# Patient Record
Sex: Female | Born: 1985 | Hispanic: Yes | Marital: Married | Smoking: Never smoker
Health system: Southern US, Community
[De-identification: ages and names within clinical notes are randomized; demographics above are authoritative.]

## PROBLEM LIST (undated history)

## (undated) ENCOUNTER — Inpatient Hospital Stay (HOSPITAL_COMMUNITY): Payer: Self-pay

## (undated) DIAGNOSIS — Z789 Other specified health status: Secondary | ICD-10-CM

## (undated) DIAGNOSIS — O139 Gestational [pregnancy-induced] hypertension without significant proteinuria, unspecified trimester: Secondary | ICD-10-CM

## (undated) HISTORY — PX: WISDOM TOOTH EXTRACTION: SHX21

---

## 2013-11-02 LAB — OB RESULTS CONSOLE RPR: RPR: NONREACTIVE

## 2013-11-02 LAB — OB RESULTS CONSOLE GC/CHLAMYDIA
CHLAMYDIA, DNA PROBE: NEGATIVE
Gonorrhea: NEGATIVE

## 2013-11-02 LAB — OB RESULTS CONSOLE ANTIBODY SCREEN: Antibody Screen: NEGATIVE

## 2013-11-02 LAB — OB RESULTS CONSOLE HIV ANTIBODY (ROUTINE TESTING): HIV: NONREACTIVE

## 2013-11-02 LAB — OB RESULTS CONSOLE ABO/RH: RH TYPE: POSITIVE

## 2013-11-02 LAB — OB RESULTS CONSOLE RUBELLA ANTIBODY, IGM: Rubella: IMMUNE

## 2013-11-02 LAB — OB RESULTS CONSOLE HEPATITIS B SURFACE ANTIGEN: Hepatitis B Surface Ag: NEGATIVE

## 2014-02-14 ENCOUNTER — Other Ambulatory Visit: Payer: Self-pay

## 2014-03-01 ENCOUNTER — Other Ambulatory Visit: Payer: Self-pay | Admitting: Certified Nurse Midwife

## 2014-03-01 ENCOUNTER — Encounter (HOSPITAL_COMMUNITY): Payer: Self-pay | Admitting: *Deleted

## 2014-03-01 ENCOUNTER — Inpatient Hospital Stay (HOSPITAL_COMMUNITY): Payer: 59

## 2014-03-01 ENCOUNTER — Encounter: Payer: Self-pay | Admitting: Obstetrics and Gynecology

## 2014-03-01 ENCOUNTER — Inpatient Hospital Stay (HOSPITAL_COMMUNITY)
Admission: AD | Admit: 2014-03-01 | Discharge: 2014-03-01 | Disposition: A | Discharge status: Home / Self Care | Payer: 59 | Source: Ambulatory Visit | Attending: Obstetrics and Gynecology | Admitting: Obstetrics and Gynecology

## 2014-03-01 DIAGNOSIS — M545 Low back pain, unspecified: Secondary | ICD-10-CM | POA: Insufficient documentation

## 2014-03-01 DIAGNOSIS — R1032 Left lower quadrant pain: Secondary | ICD-10-CM | POA: Insufficient documentation

## 2014-03-01 DIAGNOSIS — K649 Unspecified hemorrhoids: Secondary | ICD-10-CM

## 2014-03-01 DIAGNOSIS — O4400 Placenta previa specified as without hemorrhage, unspecified trimester: Secondary | ICD-10-CM | POA: Insufficient documentation

## 2014-03-01 HISTORY — DX: Other specified health status: Z78.9

## 2014-03-01 LAB — URINALYSIS, ROUTINE W REFLEX MICROSCOPIC
Bilirubin Urine: NEGATIVE
Glucose, UA: NEGATIVE mg/dL
KETONES UR: NEGATIVE mg/dL
Leukocytes, UA: NEGATIVE
NITRITE: NEGATIVE
PH: 6 (ref 5.0–8.0)
PROTEIN: NEGATIVE mg/dL
Specific Gravity, Urine: 1.015 (ref 1.005–1.030)
Urobilinogen, UA: 0.2 mg/dL (ref 0.0–1.0)

## 2014-03-01 LAB — URINE MICROSCOPIC-ADD ON

## 2014-03-01 NOTE — MAU Note (Signed)
C/o L lower back and L lower abdominal pain since 1000 today;

## 2014-03-01 NOTE — Discharge Instructions (Signed)
Placenta Previa   Placenta previa is a condition in pregnant women where the placenta implants in the lower part of the uterus. The placenta either partially or completely covers the opening to the cervix. This is a problem because the baby must pass through the cervix during delivery. There are three types of placenta previa. They include:   1. Marginal placenta previa. The placenta is near the cervix, but does not cover the opening.  2. Partial placenta previa. The placenta covers part of the cervical opening.  3. Complete placenta previa. The placenta covers the entire cervical opening.    Depending on the type of placenta previa, there is a chance the placenta may move into a normal position and no longer cover the cervix as the pregnancy progresses. It is important to keep all prenatal visits with your caregiver.   RISK FACTORS  You may be more likely to develop placenta previa if you:   · Are carrying more than one baby (multiples).    · Have an abnormally shaped uterus.    · Have scars on the lining of the uterus.    · Had previous surgeries involving the uterus, such as a cesarean delivery.    · Have delivered a baby previously.    · Have a history of placenta previa.    · Have smoked or used cocaine during pregnancy.    · Are age 35 or older during pregnancy.    SYMPTOMS  The main symptom of placenta previa is sudden, painless vaginal bleeding during the second half of pregnancy. The amount of bleeding can be light to very heavy. The bleeding may stop on its own, but almost always returns. Cramping, regular contractions, abdominal pain, and lower back pain can also occur with placenta previa.   DIAGNOSIS  Placenta previa can be diagnosed through an ultrasound by finding where the placenta is located. The ultrasound may find placenta previa either during a routine prenatal visit or after vaginal bleeding is noticed. If you are diagnosed with placenta previa, your caregiver may avoid vaginal exams to reduce  the risk of heavy bleeding. There is a chance that placenta previa may not be diagnosed until bleeding occurs during labor.   TREATMENT  Specific treatment depends on:   · How much you are bleeding or if the bleeding has stopped.  · How far along you are in your pregnancy.    · The condition of the baby.    · The location of the baby and placenta.    · The type of placenta previa.    Depending on the factors above, your caregiver may recommend:   · Decreased activity.    · Bed rest at home or in the hospital.  · Pelvic rest. This means no sex, using tampons, douching, pelvic exams, or placing anything into the vagina.  · A blood transfusion to replace maternal blood loss.  · A cesarean delivery if the bleeding is heavy and cannot be controlled or the placenta completely covers the cervix.  · Medication to stop premature labor or mature the fetal lungs if delivery is needed before the pregnancy is full term.    WHEN SHOULD YOU SEEK IMMEDIATE MEDICAL CARE IF YOU ARE SENT HOME WITH PLACENTA PREVIA?  Seek immediate medical care if you show any symptoms of placenta previa. You will need to go to the hospital to get checked immediately. Again, those symptoms are:  · Sudden, painless vaginal bleeding, even a small amount.  · Cramping or regular contractions.  · Lower back or abdominal pain.  Document Released: 08/17/2005   Document Revised: 04/19/2013 Document Reviewed: 11/18/2012  ExitCare® Patient Information ©2015 [employer]. This information is not intended to replace advice given to you by your health care provider. Make sure you discuss any questions you have with your health care provider.

## 2014-03-01 NOTE — MAU Provider Note (Signed)
History   27yo G1 P0 at 24.6 weeks  W/known Previa  at 6/16 US Pt present to MAU c/o continuous LLQ and lower back 8/10 this am that was unrelieved with tylenol.   She denies lof, vb, intercourse, abn activity, fever, n/v or dysuria.  +FM, no cramping  Patient Active Problem List   Diagnosis Date Noted  . Placenta previa 03/01/2014    Chief Complaint  Patient presents with  . Back Pain  . Abdominal Pain   HPI  See above  OB History   Grav Para Term Preterm Abortions TAB SAB Ect Mult Living   1               Past Medical History  Diagnosis Date  . Medical history non-contributory     Past Surgical History  Procedure Laterality Date  . No past surgeries      Family History  Problem Relation Age of Onset  . Alcohol abuse Neg Hx   . Arthritis Neg Hx   . Asthma Neg Hx   . Birth defects Neg Hx   . Cancer Neg Hx   . COPD Neg Hx   . Depression Neg Hx   . Diabetes Neg Hx   . Drug abuse Neg Hx   . Early death Neg Hx   . Hearing loss Neg Hx   . Heart disease Neg Hx   . Hyperlipidemia Neg Hx   . Kidney disease Neg Hx   . Hypertension Neg Hx   . Learning disabilities Neg Hx   . Mental illness Neg Hx   . Mental retardation Neg Hx   . Miscarriages / Stillbirths Neg Hx   . Stroke Neg Hx   . Vision loss Neg Hx   . Varicose Veins Neg Hx     History  Substance Use Topics  . Smoking status: Never Smoker   . Smokeless tobacco: Not on file  . Alcohol Use: No    Allergies: No Known Allergies  Prescriptions prior to admission  Medication Sig Dispense Refill  . acetaminophen (TYLENOL) 500 MG tablet Take 1,000 mg by mouth every 6 (six) hours as needed for mild pain.      . Prenatal Vit-Fe Fumarate-FA (PRENATAL MULTIVITAMIN) TABS tablet Take 1 tablet by mouth daily at 12 noon.        ROS   LLQ pain and left sided back pain.  +FM  Physical Exam     Blood pressure 100/56, pulse 78, temperature 98.1 F (36.7 C), temperature source Oral, resp. rate 18, height 4\' 11"   (1.499 m), weight 136 lb (61.689 kg).  Physical Exam  Constitutional: She is oriented to person, place, and time. She appears well-developed.  HENT:  Head: Normocephalic.  Neck: Normal range of motion.  Respiratory: Breath sounds normal.  Neurological: She is alert and oriented to person, place, and time.  Skin: Skin is warm and dry.  Chest: BBS Heart: RRR ABD: Gravid Soft and non tender except for LLQ tenderness with palpation Pelvic: deferred d/t previa, no bleeding noted Extremities: WNL FHT: reassuring for EGA CTX: irritability with occasional ctx   ED Course  Assessment: IUP 24.6 known previa LLQ abd and back pain    Plan: UA Limited US for placenta location and cervix  Consult with Dr Normand Sloopillard Report given to [facility] CNM.     Standard, Venus CNM, MSN 03/01/2014 6:25 PM   Addendum: at 2100  Pt denies any pain now,  US WNL, anterior previa identified, no other  abnormalities  Pt instructed on bleeding precautions, pelvic rest May return to work tmrw  dc'd home stable F/u routine  S.Amani Marseille, CNM

## 2014-04-03 ENCOUNTER — Other Ambulatory Visit: Payer: Self-pay

## 2014-04-03 ENCOUNTER — Other Ambulatory Visit: Payer: Self-pay | Admitting: Obstetrics and Gynecology

## 2014-04-03 DIAGNOSIS — Z3689 Encounter for other specified antenatal screening: Secondary | ICD-10-CM

## 2014-04-03 DIAGNOSIS — O4400 Placenta previa specified as without hemorrhage, unspecified trimester: Secondary | ICD-10-CM

## 2014-04-13 ENCOUNTER — Encounter (HOSPITAL_COMMUNITY): Payer: 59

## 2014-04-13 ENCOUNTER — Other Ambulatory Visit (HOSPITAL_COMMUNITY): Payer: 59

## 2014-04-17 ENCOUNTER — Other Ambulatory Visit: Payer: Self-pay | Admitting: Obstetrics and Gynecology

## 2014-04-17 ENCOUNTER — Ambulatory Visit (HOSPITAL_COMMUNITY)
Admission: RE | Admit: 2014-04-17 | Discharge: 2014-04-17 | Disposition: A | Discharge status: Home / Self Care | Payer: 59 | Source: Ambulatory Visit | Attending: Obstetrics and Gynecology | Admitting: Obstetrics and Gynecology

## 2014-04-17 ENCOUNTER — Encounter (HOSPITAL_COMMUNITY): Payer: Self-pay

## 2014-04-17 DIAGNOSIS — Z3689 Encounter for other specified antenatal screening: Secondary | ICD-10-CM | POA: Insufficient documentation

## 2014-04-17 DIAGNOSIS — O4410 Placenta previa with hemorrhage, unspecified trimester: Secondary | ICD-10-CM | POA: Diagnosis not present

## 2014-04-17 DIAGNOSIS — O4403 Placenta previa specified as without hemorrhage, third trimester: Secondary | ICD-10-CM

## 2014-04-17 DIAGNOSIS — O4400 Placenta previa specified as without hemorrhage, unspecified trimester: Secondary | ICD-10-CM

## 2014-04-17 NOTE — Progress Notes (Signed)
[facility] ultrasound  Indication: 28 yr old G1P0 at 4731w4d with placenta previa for fetal ultrasound.  Findings: 1. Single intrauterine pregnancy. 2. Estimated fetal weight is in the 59th%. 3. Anterior placenta previa. 4. Normal amniotic fluid index. 5. Normal transvaginal cervical length. 6. Anatomy survey is limited by advanced gestational age and fetal position; no abnormalities seen.  Recommendations: 1. Appropriate fetal growth. 2. Normal limited anatomy survey: - patient previously had normal anatomy survey with primary OB 3. Placenta previa: - see consult letter - Discussed increased risk of bleeding which could be severe bleeding - Recommend pelvic rest with no intercourse  - Recommend no strenuous activity - Discussed bleeding precautions- patient is to call her primary Ob with any vaginal bleeding and be evaluated  - Recommend reevaluate placental location in 4 weeks; if the previa does not resolve we would recommend delivery via C section at 37 weeks as the risk of vaginal bleeding with labor is increased  Eulis FosterKristen Syrianna Schillaci, MD

## 2014-04-17 NOTE — Consult Note (Signed)
MFM consult  28 yr old G1P0 at 2951w4d with placenta previa referred by Dr. Su Hiltoberts for fetal ultrasound and consult.  Past OB hx: primagravid   Ultrasound today shows: single intrauterine pregnancy.  Estimated fetal weight is in the 59th%.  Anterior placenta previa.  Normal amniotic fluid index. Normal transvaginal cervical length.  Anatomy survey is limited by advanced gestational age and fetal position; no abnormalities seen.   I counseled the patient as follows:  1. Appropriate fetal growth.  2. Normal limited anatomy survey:  - patient previously had normal anatomy survey with primary OB  3. Placenta previa:  - Discussed increased risk of bleeding which could be severe bleeding  - Recommend pelvic rest with no intercourse  - Recommend no strenuous activity  - Discussed bleeding precautions- patient is to call her primary Ob with any vaginal bleeding and be evaluated  - Recommend reevaluate placental location in 4 weeks; if the previa does not resolve we would recommend delivery via C section at 37 weeks as the risk of vaginal bleeding with labor is increased   I spent a total of 30 minutes with the patient of which >50% was in face to face consultation.  Eulis FosterKristen Hodari Chuba, MD

## 2014-04-18 ENCOUNTER — Other Ambulatory Visit: Payer: Self-pay | Admitting: Obstetrics and Gynecology

## 2014-04-18 DIAGNOSIS — Z3689 Encounter for other specified antenatal screening: Secondary | ICD-10-CM

## 2014-04-18 DIAGNOSIS — O4400 Placenta previa specified as without hemorrhage, unspecified trimester: Secondary | ICD-10-CM

## 2014-04-28 ENCOUNTER — Encounter (HOSPITAL_COMMUNITY): Payer: Self-pay

## 2014-04-28 ENCOUNTER — Inpatient Hospital Stay (HOSPITAL_COMMUNITY)
Admission: AD | Admit: 2014-04-28 | Discharge: 2014-04-28 | Disposition: A | Discharge status: Home / Self Care | Payer: 59 | Source: Ambulatory Visit | Attending: Obstetrics & Gynecology | Admitting: Obstetrics & Gynecology

## 2014-04-28 DIAGNOSIS — N898 Other specified noninflammatory disorders of vagina: Secondary | ICD-10-CM | POA: Diagnosis not present

## 2014-04-28 DIAGNOSIS — O99891 Other specified diseases and conditions complicating pregnancy: Secondary | ICD-10-CM | POA: Insufficient documentation

## 2014-04-28 DIAGNOSIS — O4700 False labor before 37 completed weeks of gestation, unspecified trimester: Secondary | ICD-10-CM | POA: Diagnosis not present

## 2014-04-28 DIAGNOSIS — O4703 False labor before 37 completed weeks of gestation, third trimester: Secondary | ICD-10-CM

## 2014-04-28 DIAGNOSIS — R109 Unspecified abdominal pain: Secondary | ICD-10-CM | POA: Diagnosis not present

## 2014-04-28 DIAGNOSIS — O26899 Other specified pregnancy related conditions, unspecified trimester: Secondary | ICD-10-CM

## 2014-04-28 DIAGNOSIS — O4400 Placenta previa specified as without hemorrhage, unspecified trimester: Secondary | ICD-10-CM | POA: Diagnosis present

## 2014-04-28 DIAGNOSIS — O9989 Other specified diseases and conditions complicating pregnancy, childbirth and the puerperium: Principal | ICD-10-CM

## 2014-04-28 LAB — URINE MICROSCOPIC-ADD ON

## 2014-04-28 LAB — URINALYSIS, ROUTINE W REFLEX MICROSCOPIC
BILIRUBIN URINE: NEGATIVE
Glucose, UA: NEGATIVE mg/dL
Ketones, ur: NEGATIVE mg/dL
Nitrite: NEGATIVE
PH: 7 (ref 5.0–8.0)
Protein, ur: NEGATIVE mg/dL
Specific Gravity, Urine: <1.005 — ABNORMAL LOW (ref 1.005–1.030)
Urobilinogen, UA: 0.2 mg/dL (ref 0.0–1.0)

## 2014-04-28 LAB — WET PREP, GENITAL
CLUE CELLS WET PREP: NONE SEEN
Trich, Wet Prep: NONE SEEN
YEAST WET PREP: NONE SEEN

## 2014-04-28 LAB — POCT FERN TEST: POCT Fern Test: NEGATIVE

## 2014-04-28 MED ORDER — LACTATED RINGERS IV BOLUS (SEPSIS)
500.0000 mL | Freq: Once | INTRAVENOUS | Status: AC
  Administered 2014-04-28: 500 mL via INTRAVENOUS

## 2014-04-28 MED ORDER — LACTATED RINGERS IV SOLN: INTRAVENOUS | Status: DC

## 2014-04-28 NOTE — MAU Note (Signed)
I was at work and felt some vag d/c. Went to [facility] and had a lot of clear, sticky mucousy d/c. Some pressure in lower abd for a wk. Comes and goes.

## 2014-04-28 NOTE — MAU Provider Note (Signed)
History  28 yo G1P0 @ 33 1/7 wks w/ known anterior previa presents to MAU w/ c/o change in discharge. States around noon today she noticed that her underwear were damp and that her discharge appeared thicker than normal and mucousy. Denies vaginal bleeding or odor. Reports pelvic rest and no strenuous activity since dx of previa on 04/17/14. 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 Only wants work note for today. Has f/u scan around 05/19/14. Also reports intermittent low abdominal discomfort that she has had for about a week.   Patient Active Problem List   Diagnosis Date Noted  . Vaginal discharge, non-hemorrhagic 04/28/2014  . Abdominal cramping affecting pregnancy 04/28/2014  . Preterm uterine contractions in third trimester, antepartum 04/28/2014  . Placenta previa 03/01/2014  . Hemorrhoid 03/01/2014    Chief Complaint  Patient presents with  . Vaginal Discharge  . Abdominal Pain   HPI See above OB History   Grav Para Term Preterm Abortions TAB SAB Ect Mult Living   1               Past Medical History  Diagnosis Date  . Medical history non-contributory     Past Surgical History  Procedure Laterality Date  . No past surgeries      Family History  Problem Relation Age of Onset  . Alcohol abuse Neg Hx   . Arthritis Neg Hx   . Asthma Neg Hx   . Birth defects Neg Hx   . Cancer Neg Hx   . COPD Neg Hx   . Depression Neg Hx   . Diabetes Neg Hx   . Drug abuse Neg Hx   . Early death Neg Hx   . Hearing loss Neg Hx   . Heart disease Neg Hx   . Hyperlipidemia Neg Hx   . Kidney disease Neg Hx   . Hypertension Neg Hx   . Learning disabilities Neg Hx   . Mental illness Neg Hx   . Mental retardation Neg Hx   . Miscarriages / Stillbirths Neg Hx   . Stroke Neg Hx   . Vision loss Neg Hx   . Varicose Veins Neg Hx     History  Substance Use Topics  . Smoking status: Never Smoker   . Smokeless tobacco: Not on file  . Alcohol Use: No    Allergies: No Known  Allergies  Prescriptions prior to admission  Medication Sig Dispense Refill  . acetaminophen (TYLENOL) 500 MG tablet Take 1,000 mg by mouth every 6 (six) hours as needed for mild pain.      . Prenatal Vit-Fe Fumarate-FA (PRENATAL MULTIVITAMIN) TABS tablet Take 1 tablet by mouth daily.         ROS +FM No VB Low abdominal pain Change in vaginal discharge Physical Exam   Blood pressure 104/59, pulse 90, temperature 98.1 F (36.7 C), resp. rate 18, height  (1.499 m), weight 149 lb 9.6 oz (67.858 kg), last menstrual period 09/08/2013. Results for orders placed during the hospital encounter of 04/28/14 (from the past 24 hour(s))  URINALYSIS, ROUTINE W REFLEX MICROSCOPIC     Status: Abnormal   Collection Time    04/28/14  8:05 PM      Result Value Ref Range   Color, Urine YELLOW  YELLOW   APPearance CLEAR  CLEAR   Specific Gravity, Urine <1.005 (*) 1.005 - 1.030   pH 7.0  5.0 - 8.0   Glucose, UA NEGATIVE  NEGATIVE mg/dL   Hgb urine  dipstick SMALL (*) NEGATIVE   Bilirubin Urine NEGATIVE  NEGATIVE   Ketones, ur NEGATIVE  NEGATIVE mg/dL   Protein, ur NEGATIVE  NEGATIVE mg/dL   Urobilinogen, UA 0.2  0.0 - 1.0 mg/dL   Nitrite NEGATIVE  NEGATIVE   Leukocytes, UA TRACE (*) NEGATIVE  URINE MICROSCOPIC-ADD ON     Status: Abnormal   Collection Time    04/28/14  8:05 PM      Result Value Ref Range   Squamous Epithelial / LPF FEW (*) RARE   WBC, UA 0-2  <3 WBC/hpf   RBC / HPF 3-6  <3 RBC/hpf   Bacteria, UA RARE  RARE  WET PREP, GENITAL     Status: Abnormal   Collection Time    04/28/14  9:27 PM      Result Value Ref Range   Yeast Wet Prep HPF POC NONE SEEN  NONE SEEN   Trich, Wet Prep NONE SEEN  NONE SEEN   Clue Cells Wet Prep HPF POC NONE SEEN  NONE SEEN   WBC, Wet Prep HPF POC FEW (*) NONE SEEN  POCT FERN TEST     Status: None   Collection Time    04/28/14  9:35 PM      Result Value Ref Range   POCT Fern Test Negative = intact amniotic membranes      Physical Exam Gen:  NAD Lungs: CTAB CV: RRR Abdomen: gravid, soft, NTND, no guarding, no rebound Pelvic: Gentle spec exam showed moderate amount of thick white discharge, no odor, no bleeding. Sample taken for wet prep. Couple declined GC/CT Neg pooling, neg valsalva, neg fern  Cervix appears closed FHRT reasurring for this gestational age Toco: Irritability w/ occ, mild ctxs; resolved w/ IVFs ED Course  Assessment: Anterior previa since 04/17/14 Leukorrhea Wet prep neg Neg ROM  PTCs   Plan: Pt was presented to Dr. Sallye Ober prior to gentle spec exam LR 500 ml bolus, then 125 ml/hr  Sherre Scarlet CNM, MS 04/28/2014 10:08 PM  ADDENDUM:  Reasurrance given to pt Continue to follow all instructions regarding previa Strict PTL precautions Admits to feeling better post IVFs Work note Keep next [employer] appt

## 2014-04-28 NOTE — Discharge Instructions (Signed)
Placenta Previa   Placenta previa is a condition in pregnant women where the placenta implants in the lower part of the uterus. The placenta either partially or completely covers the opening to the cervix. This is a problem because the baby must pass through the cervix during delivery. There are three types of placenta previa. They include:   1. Marginal placenta previa. The placenta is near the cervix, but does not cover the opening.  2. Partial placenta previa. The placenta covers part of the cervical opening.  3. Complete placenta previa. The placenta covers the entire cervical opening.    Depending on the type of placenta previa, there is a chance the placenta may move into a normal position and no longer cover the cervix as the pregnancy progresses. It is important to keep all prenatal visits with your caregiver.   RISK FACTORS  You may be more likely to develop placenta previa if you:   · Are carrying more than one baby (multiples).    · Have an abnormally shaped uterus.    · Have scars on the lining of the uterus.    · Had previous surgeries involving the uterus, such as a cesarean delivery.    · Have delivered a baby previously.    · Have a history of placenta previa.    · Have smoked or used cocaine during pregnancy.    · Are age 35 or older during pregnancy.    SYMPTOMS  The main symptom of placenta previa is sudden, painless vaginal bleeding during the second half of pregnancy. The amount of bleeding can be light to very heavy. The bleeding may stop on its own, but almost always returns. Cramping, regular contractions, abdominal pain, and lower back pain can also occur with placenta previa.   DIAGNOSIS  Placenta previa can be diagnosed through an ultrasound by finding where the placenta is located. The ultrasound may find placenta previa either during a routine prenatal visit or after vaginal bleeding is noticed. If you are diagnosed with placenta previa, your caregiver may avoid vaginal exams to reduce  the risk of heavy bleeding. There is a chance that placenta previa may not be diagnosed until bleeding occurs during labor.   TREATMENT  Specific treatment depends on:   · How much you are bleeding or if the bleeding has stopped.  · How far along you are in your pregnancy.    · The condition of the baby.    · The location of the baby and placenta.    · The type of placenta previa.    Depending on the factors above, your caregiver may recommend:   · Decreased activity.    · Bed rest at home or in the hospital.  · Pelvic rest. This means no sex, using tampons, douching, pelvic exams, or placing anything into the vagina.  · A blood transfusion to replace maternal blood loss.  · A cesarean delivery if the bleeding is heavy and cannot be controlled or the placenta completely covers the cervix.  · Medication to stop premature labor or mature the fetal lungs if delivery is needed before the pregnancy is full term.    WHEN SHOULD YOU SEEK IMMEDIATE MEDICAL CARE IF YOU ARE SENT HOME WITH PLACENTA PREVIA?  Seek immediate medical care if you show any symptoms of placenta previa. You will need to go to the hospital to get checked immediately. Again, those symptoms are:  · Sudden, painless vaginal bleeding, even a small amount.  · Cramping or regular contractions.  · Lower back or abdominal pain.  Document Released: 08/17/2005   Document Revised: 04/19/2013 Document Reviewed: 11/18/2012  ExitCare® Patient Information ©2015 [employer]. This information is not intended to replace advice given to you by your health care provider. Make sure you discuss any questions you have with your health care provider.

## 2014-04-30 LAB — URINE CULTURE

## 2014-05-06 ENCOUNTER — Encounter (HOSPITAL_COMMUNITY): Payer: Self-pay | Admitting: *Deleted

## 2014-05-06 ENCOUNTER — Inpatient Hospital Stay (HOSPITAL_COMMUNITY)
Admission: AD | Admit: 2014-05-06 | Discharge: 2014-05-07 | DRG: 781 | Disposition: A | Discharge status: Home / Self Care | Payer: 59 | Source: Ambulatory Visit | Attending: Obstetrics and Gynecology | Admitting: Obstetrics and Gynecology

## 2014-05-06 ENCOUNTER — Observation Stay (HOSPITAL_COMMUNITY): Payer: 59

## 2014-05-06 DIAGNOSIS — K219 Gastro-esophageal reflux disease without esophagitis: Secondary | ICD-10-CM | POA: Diagnosis present

## 2014-05-06 DIAGNOSIS — Z8751 Personal history of pre-term labor: Secondary | ICD-10-CM

## 2014-05-06 DIAGNOSIS — O4700 False labor before 37 completed weeks of gestation, unspecified trimester: Secondary | ICD-10-CM | POA: Diagnosis present

## 2014-05-06 DIAGNOSIS — O322XX0 Maternal care for transverse and oblique lie, not applicable or unspecified: Secondary | ICD-10-CM | POA: Diagnosis present

## 2014-05-06 DIAGNOSIS — O99019 Anemia complicating pregnancy, unspecified trimester: Secondary | ICD-10-CM | POA: Diagnosis present

## 2014-05-06 DIAGNOSIS — D649 Anemia, unspecified: Secondary | ICD-10-CM | POA: Diagnosis present

## 2014-05-06 DIAGNOSIS — Z3689 Encounter for other specified antenatal screening: Secondary | ICD-10-CM

## 2014-05-06 DIAGNOSIS — O4403 Placenta previa specified as without hemorrhage, third trimester: Secondary | ICD-10-CM

## 2014-05-06 DIAGNOSIS — O4400 Placenta previa specified as without hemorrhage, unspecified trimester: Principal | ICD-10-CM | POA: Diagnosis present

## 2014-05-06 LAB — CBC
HCT: 28.4 % — ABNORMAL LOW (ref 36.0–46.0)
Hemoglobin: 9.7 g/dL — ABNORMAL LOW (ref 12.0–15.0)
MCH: 31.8 pg (ref 26.0–34.0)
MCHC: 34.2 g/dL (ref 30.0–36.0)
MCV: 93.1 fL (ref 78.0–100.0)
Platelets: 136 10*3/uL — ABNORMAL LOW (ref 150–400)
RBC: 3.05 MIL/uL — AB (ref 3.87–5.11)
RDW: 12.6 % (ref 11.5–15.5)
WBC: 12 10*3/uL — AB (ref 4.0–10.5)

## 2014-05-06 LAB — URINE MICROSCOPIC-ADD ON

## 2014-05-06 LAB — URINALYSIS, ROUTINE W REFLEX MICROSCOPIC
BILIRUBIN URINE: NEGATIVE
GLUCOSE, UA: NEGATIVE mg/dL
Ketones, ur: NEGATIVE mg/dL
Leukocytes, UA: NEGATIVE
Nitrite: NEGATIVE
PROTEIN: NEGATIVE mg/dL
Specific Gravity, Urine: 1.01 (ref 1.005–1.030)
UROBILINOGEN UA: 0.2 mg/dL (ref 0.0–1.0)
pH: 6 (ref 5.0–8.0)

## 2014-05-06 LAB — MAGNESIUM: Magnesium: 3.7 mg/dL — ABNORMAL HIGH (ref 1.5–2.5)

## 2014-05-06 MED ORDER — LACTATED RINGERS IV BOLUS (SEPSIS)
500.0000 mL | Freq: Once | INTRAVENOUS | Status: AC
  Administered 2014-05-06: 500 mL via INTRAVENOUS

## 2014-05-06 MED ORDER — FERROUS SULFATE 325 (65 FE) MG PO TABS
325.0000 mg | ORAL_TABLET | Freq: Two times a day (BID) | ORAL | Status: DC
Start: 2014-05-06 — End: 2014-05-07
  Administered 2014-05-06 – 2014-05-07 (×3): 325 mg via ORAL
  Filled 2014-05-06 (×3): qty 1

## 2014-05-06 MED ORDER — TERBUTALINE SULFATE 1 MG/ML IJ SOLN
0.2500 mg | Freq: Once | INTRAMUSCULAR | Status: AC
  Administered 2014-05-06: 0.25 mg via SUBCUTANEOUS
  Filled 2014-05-06: qty 1

## 2014-05-06 MED ORDER — LACTATED RINGERS IV SOLN
INTRAVENOUS | Status: DC
  Administered 2014-05-06 – 2014-05-07 (×3): via INTRAVENOUS

## 2014-05-06 MED ORDER — GI COCKTAIL ~~LOC~~
30.0000 mL | Freq: Once | ORAL | Status: AC
  Administered 2014-05-06: 30 mL via ORAL
  Filled 2014-05-06: qty 30

## 2014-05-06 MED ORDER — LACTATED RINGERS IV BOLUS (SEPSIS)
1000.0000 mL | Freq: Once | INTRAVENOUS | Status: AC
  Administered 2014-05-06: 1000 mL via INTRAVENOUS

## 2014-05-06 MED ORDER — PRENATAL MULTIVITAMIN CH
1.0000 | ORAL_TABLET | Freq: Every day | ORAL | Status: DC
  Administered 2014-05-06 – 2014-05-07 (×2): 1 via ORAL
  Filled 2014-05-06 (×2): qty 1

## 2014-05-06 MED ORDER — MAGNESIUM SULFATE BOLUS VIA INFUSION
4.0000 g | Freq: Once | INTRAVENOUS | Status: AC
  Administered 2014-05-06: 4 g via INTRAVENOUS
  Filled 2014-05-06: qty 500

## 2014-05-06 MED ORDER — ONDANSETRON HCL 4 MG/2ML IJ SOLN
4.0000 mg | Freq: Four times a day (QID) | INTRAMUSCULAR | Status: DC
  Administered 2014-05-06: 4 mg via INTRAVENOUS
  Filled 2014-05-06: qty 2

## 2014-05-06 MED ORDER — NIFEDIPINE 10 MG PO CAPS
10.0000 mg | ORAL_CAPSULE | ORAL | Status: AC | PRN
  Administered 2014-05-06 (×4): 10 mg via ORAL
  Filled 2014-05-06 (×4): qty 1

## 2014-05-06 MED ORDER — MAGNESIUM SULFATE 40 G IN LACTATED RINGERS - SIMPLE
2.5000 g/h | INTRAVENOUS | Status: DC
Start: 2014-05-06 — End: 2014-05-07
  Administered 2014-05-07: 2.5 g/h via INTRAVENOUS
  Filled 2014-05-06 (×2): qty 500

## 2014-05-06 MED ORDER — LACTATED RINGERS IV SOLN
INTRAVENOUS | Status: DC
  Administered 2014-05-06: 04:00:00 via INTRAVENOUS

## 2014-05-06 NOTE — Progress Notes (Signed)
V. Standard CNM notified of pt's uterine activity. CNM will talk with Dr Charlotta Newton and call back with plan of care

## 2014-05-06 NOTE — Progress Notes (Signed)
Spec exam done. Whitish discharge present with no bleeding. Cervix closed visually thru spec

## 2014-05-06 NOTE — Progress Notes (Signed)
Hospital day # 0 pregnancy at [redacted]w[redacted]d--PTCs, on Magnesium Sulfate therapy since 3:20 pm today. Was hoping to go home tonight. Wants to shower; states takes 4 a day.  S:  Reports midsternal chest pain that began around 7 pm. States just pain, not  pressure, stabbing or achy type discomfort, "just pain."      Perception of contractions: none      Vaginal bleeding: none       Vaginal discharge: no significant change  O: BP 112/65  Pulse 109  Temp(Src) 98.4 F (36.9 C) (Oral)  Resp 18  Ht  (1.499 m)  Wt 148 lb 12.8 oz (67.495 kg)  BMI 30.04 kg/m2  SpO2 100%  LMP 09/08/2013      Gen: Awake, alert & oriented. Tearful after hearing that she would not be going home tonight.      Lungs: CTAB.      CV: RRR w/o murmur.      Fetal tracings: BL 135 w/ mod variability, +accels, no decels.      Contractions: Occasional.         Uterus gravid and non-tender.      Extremities: extremities normal, atraumatic, no cyanosis or edema and Homans sign is negative, no sign of DVT.      A:  [redacted]w[redacted]d with PTCs. Magnesium Sulfate started at 3:20 pm today.      Suspect GERD.      Anterior previa.  P: Continue current plan of care.      Magnesium level pending.      Continue to provide reassurance.      GI cocktail.      May shower.      Dr. Charlotta Newton updated.      Re-evaluate in a couple of hours.           Sherre Scarlet CNM, MS 05/06/2014 7:36 PM

## 2014-05-06 NOTE — H&P (Signed)
Megan Johnson is a 28 y.o. female, G1 P0 at 34.2 weeks  Patient Active Problem List   Diagnosis Date Noted  . Preterm labor 05/06/2014  . Vaginal discharge, non-hemorrhagic 04/28/2014  . Abdominal cramping affecting pregnancy 04/28/2014  . Preterm uterine contractions in third trimester, antepartum 04/28/2014  . Placenta previa 03/01/2014  . Hemorrhoid 03/01/2014    Pregnancy Course: Patient entered care at 8.5 weeks.   EDC of 06/15/14 was established by LMP.      [place] evaluations:  8.5 weeks - Dating:  C/W size, FHT 166, anteverted uterus, amnion and yolk sac seen, normal fluid, cervix closed 12.5 weeks - 1st trimester screen: CRL 91% at 13.4 weeks, FHR 148, placenta anterior right lateral 20.5 weeks - Anatomy:  size c/w dates. AUA 20.4 weeks, EFW 13oz - 48%, FHR 145, vertex, anterior placenta previa,                        lower edge covers the internal os, normal cord insertion, normal fluid AP pocket 5.2cm, cervix closed,                        RVOT and DA not seen   23.0 weeks - FU: size c/w dates, EFW 1lb 4oz - 58%, [id number], vertex, persistent previa, normal fluid AP pocket                        4.9cm, all anatomy seen and wnl. 24.6 weeks - Cervical length 5.31, persistent previa, no abruption noted, fluid normal 29.5 weeks - AUA 30.0, placenta previa persists, FHR 148, EFW 3lbs 6oz, 64%, AFI 13cm, cvx 3.5, transverse lie,                        head maternal left 31.4 weeks - cephalic, cervical length 3.6cm, AFI 8.17, FHR 131, anterior previa, EFW 4lbs - 59%, recommendation                        CS at 37 weeks  Significant prenatal events:   Placenta previa, Celiac panel, TSH, CBC, BMP WNL per Dr. Loreta Ave Last evaluation:   33.5 weeks  SSE:C/T/H on 05/06/14  Reason for admission:  contractions  Pt States:     Contractions Frequency: q 4 minutes on arrival, and now occassional      Contraction severity: mild      Fetal activity: +FM  OB History   Grav Para Term Preterm  Abortions TAB SAB Ect Mult Living   1              Past Medical History  Diagnosis Date  . Medical history non-contributory    Past Surgical History  Procedure Laterality Date  . No past surgeries     Family History: family history is negative for Alcohol abuse, Arthritis, Asthma, Birth defects, Cancer, COPD, Depression, Diabetes, Drug abuse, Early death, Hearing loss, Heart disease, Hyperlipidemia, Kidney disease, Hypertension, Learning disabilities, Mental illness, Mental retardation, Miscarriages / Stillbirths, Stroke, Vision loss, and Varicose Veins. Social History:  reports that she has never smoked. She does not have any smokeless tobacco history on file. She reports that she does not drink alcohol or use illicit drugs.   Prenatal Transfer Tool  Maternal Diabetes: No Genetic Screening: Normal Maternal Ultrasounds/Referrals: Abnormal:  Findings:   Other: anterior previa Fetal Ultrasounds or other Referrals:  None Maternal  Substance Abuse:  No Significant Maternal Medications:  None Significant Maternal Lab Results: None   ROS:  See HPI above, all other systems are negative  No Known Allergies  Dilation: Closed Exam by:: V. Choya Tornow CNM by spec exam Blood pressure 108/64, pulse 100, temperature 98.4 F (36.9 C), temperature source Oral, resp. rate 18, height  (1.499 m), weight 148 lb 12.8 oz (67.495 kg), last menstrual period 09/08/2013, SpO2 96.00%.  Results for orders placed during the hospital encounter of 05/06/14 (from the past 24 hour(s))  URINALYSIS, ROUTINE W REFLEX MICROSCOPIC     Status: Abnormal   Collection Time    05/06/14 12:45 AM      Result Value Ref Range   Color, Urine YELLOW  YELLOW   APPearance CLEAR  CLEAR   Specific Gravity, Urine 1.010  1.005 - 1.030   pH 6.0  5.0 - 8.0   Glucose, UA NEGATIVE  NEGATIVE mg/dL   Hgb urine dipstick MODERATE (*) NEGATIVE   Bilirubin Urine NEGATIVE  NEGATIVE   Ketones, ur NEGATIVE  NEGATIVE mg/dL   Protein,  ur NEGATIVE  NEGATIVE mg/dL   Urobilinogen, UA 0.2  0.0 - 1.0 mg/dL   Nitrite NEGATIVE  NEGATIVE   Leukocytes, UA NEGATIVE  NEGATIVE  URINE MICROSCOPIC-ADD ON     Status: Abnormal   Collection Time    05/06/14 12:45 AM      Result Value Ref Range   Squamous Epithelial / LPF FEW (*) RARE   WBC, UA 0-2  <3 WBC/hpf   RBC / HPF 3-6  <3 RBC/hpf   Bacteria, UA RARE  RARE  CBC     Status: Abnormal   Collection Time    05/06/14  3:55 AM      Result Value Ref Range   WBC 12.0 (*) 4.0 - 10.5 K/uL   RBC 3.05 (*) 3.87 - 5.11 MIL/uL   Hemoglobin 9.7 (*) 12.0 - 15.0 g/dL   HCT 40.9 (*) 81.1 - 91.4 %   MCV 93.1  78.0 - 100.0 fL   MCH 31.8  26.0 - 34.0 pg   MCHC 34.2  30.0 - 36.0 g/dL   RDW 78.2  95.6 - 21.3 %   Platelets 136 (*) 150 - 400 K/uL   Maternal Exam:  Uterine Assessment: Contraction frequency after terb x 1 given, ctx q 8-15 minutes Abdomen: Gravid, non tender. Fundal height is aga.  Normal external genitalia, vulva, cervix, uterus and adnexa. No lesions noted on exam.  Pelvis adequate for delivery: n/a CS scheduled d/t previa Fetal presentation: Vertex by Leopold's   Fetal Exam:  Fetal Monitor Surveillance : Continuous Monitoring  Mode: Ultrasound.  FHR: Catagory  1 CTXs: Q 8-68minutes EFW   4 lbs per last [place] on 04/17/14  Physical Exam: Nursing note and vitals reviewed General: alert and cooperative She appears well nourished.  Psychiatric: Normal mood and affect. Her behavior is normal.  Head: Normocephalic.  Eyes: Pupils are equal, round, and reactive to light.  Neck: Normal range of motion.  Cardiovascular: RRR without murmur.  Respiratory: CTAB. Effort normal.  Abd: soft, non-tender, +BS, no rebound, no guarding  Genitourinary: Vagina normal.  Musculoskeletal: Normal range of motion.  Ext: Homan's sign negative bilaterally. No evidence of DVTs.  DTR 2+, no clonus, no edema Neurological: A&Ox3.  Skin: Warm and dry.   Prenatal labs: ABO, Rh:  O  positive Antibody:  negative Rubella:    immune RPR:   NR HBsAg:   negative HIV:  NR GBS:  unknown Sickle cell/Hgb electrophoresis:  WNL Pap:   GC:   negative Chlamydia: negative Genetic screenings:  negative Glucola:  wnl  Assessment:  IUP at 34.2 weeks, anterior previa NICHD: Category 1 Membranes: intact Bishop Score: n/a GBS unknown Diagnosis: preterm labor  Plan:  Admit to Antepartum unit for observation Routing [facility] AP orders Fe and PNV Proceed with delivery at 37 week Diet: reg Dr Charlotta Newton informed on pt status   Lenard Kampf, CNM, MSN 05/06/2014, 6:07 PM

## 2014-05-06 NOTE — Progress Notes (Signed)
Received 4 doses of Procardia for UCs, with last dose at 1249. Not aware of UCs now.  Filed Vitals:   05/06/14 1113 05/06/14 1220 05/06/14 1221 05/06/14 1249  BP: 105/63 98/46 98/46  91/47  Pulse:  106    Temp:      Resp:      Height:      Weight:       FHR Category 1 UCs q 5-7 min, mild. No bleeding.  Consulted with Dr. Su Hilt Will start Magnesium sulfate. Mag level in 6 hours after initiation.  Nigel Bridgeman, CNM 05/06/14 3:05p

## 2014-05-06 NOTE — Progress Notes (Addendum)
Hospital day # 0 pregnancy at [redacted]w[redacted]d--Known anterior previa, presented with cramping.  S:  Just returned from [place].      Perception of contractions: None      Vaginal bleeding: None and none on admission or since       Vaginal discharge:  None   O: BP 93/51  Pulse 103  Temp(Src) 98.7 F (37.1 C)  Resp 18  Ht  (1.499 m)  Wt 148 lb 12.8 oz (67.495 kg)  BMI 30.04 kg/m2  LMP 09/08/2013      Fetal tracings:  Category 1      Contractions:   Occasional irritability--minimal uterine activity since single      dose of Terb at 0400      Uterus non-tender      Extremities: no significant edema and no signs of DVT  US:  Funic presentation/transverse, with head on maternal right.         Anterior previa, EFW 2529 gm, 5+9, 71%ile, AFI 9.4, 15%ile,         "subjectively low-normal".          Labs:   Results for orders placed during the hospital encounter of 05/06/14 (from the past 24 hour(s))  URINALYSIS, ROUTINE W REFLEX MICROSCOPIC     Status: Abnormal   Collection Time    05/06/14 12:45 AM      Result Value Ref Range   Color, Urine YELLOW  YELLOW   APPearance CLEAR  CLEAR   Specific Gravity, Urine 1.010  1.005 - 1.030   pH 6.0  5.0 - 8.0   Glucose, UA NEGATIVE  NEGATIVE mg/dL   Hgb urine dipstick MODERATE (*) NEGATIVE   Bilirubin Urine NEGATIVE  NEGATIVE   Ketones, ur NEGATIVE  NEGATIVE mg/dL   Protein, ur NEGATIVE  NEGATIVE mg/dL   Urobilinogen, UA 0.2  0.0 - 1.0 mg/dL   Nitrite NEGATIVE  NEGATIVE   Leukocytes, UA NEGATIVE  NEGATIVE  URINE MICROSCOPIC-ADD ON     Status: Abnormal   Collection Time    05/06/14 12:45 AM      Result Value Ref Range   Squamous Epithelial / LPF FEW (*) RARE   WBC, UA 0-2  <3 WBC/hpf   RBC / HPF 3-6  <3 RBC/hpf   Bacteria, UA RARE  RARE  CBC     Status: Abnormal   Collection Time    05/06/14  3:55 AM      Result Value Ref Range   WBC 12.0 (*) 4.0 - 10.5 K/uL   RBC 3.05 (*) 3.87 - 5.11 MIL/uL   Hemoglobin 9.7 (*) 12.0 - 15.0 g/dL   HCT 16.1  (*) 09.6 - 46.0 %   MCV 93.1  78.0 - 100.0 fL   MCH 31.8  26.0 - 34.0 pg   MCHC 34.2  30.0 - 36.0 g/dL   RDW 04.5  40.9 - 81.1 %   Platelets 136 (*) 150 - 400 K/uL  Hgb 10.6 on 03/20/14        Meds:  . ferrous sulfate  325 mg Oral BID WC  . prenatal multivitamin  1 tablet Oral Q1200     A: [redacted]w[redacted]d with anterior previa      Funic presentation with transverse lie      AFI subjectively low-normal      Anemia      Resolution of uterine activity      Stable  P: Continue current plan of care      Dr. Su Hilt  will see and determine plan of care.      MDs will follow  Nigel Bridgeman CNM, [place] 05/06/2014 10:26 AM   Agree with above.  I discussed the situation with the patient.  She has never had a sentinel bleed this pregnancy and did not have any bleeding this admission.  They live 18 minutes away and are very reliable and would like to go home.  I discussed returning to hospital immediately with return of the cramping or with any signs of bleeding including spotting or brown discharge.  Will arrange appt with MFM within the week to discuss any new recs regarding delivery plan considering pt has been having cramping/ctxs.  Change in plan as I started writing the above, the pt started contracting again every .  Will try po procardia per protocol and cont to observe for now.

## 2014-05-06 NOTE — Progress Notes (Signed)
Hospital day # 0 pregnancy at [redacted]w[redacted]d--with a partial priva and ctx.  SP terb admitted for 23 hour observation.  S:  Perception of contractions: none      Baby is laying on the right side causing a little pain      Vaginal bleeding: none now       Vaginal discharge:  white and no significant change  O: BP 102/55  Pulse 92  Temp(Src) 98.7 F (37.1 C)  Resp 20  Ht  (1.499 m)  Wt 148 lb 12.8 oz (67.495 kg)  BMI 30.04 kg/m2  LMP 09/08/2013      Fetal tracings: Category 1, FHR 140 + accel, moderate-marked variability, no decel      Contractions: occassional, 2 in 1 hour      Uterus gravid and non-tender      Extremities: extremities normal, atraumatic, no cyanosis or edema and no significant edema and no signs of DVT           Results for orders placed during the hospital encounter of 05/06/14 (from the past 24 hour(s))  URINALYSIS, ROUTINE W REFLEX MICROSCOPIC     Status: Abnormal   Collection Time    05/06/14 12:45 AM      Result Value Ref Range   Color, Urine YELLOW  YELLOW   APPearance CLEAR  CLEAR   Specific Gravity, Urine 1.010  1.005 - 1.030   pH 6.0  5.0 - 8.0   Glucose, UA NEGATIVE  NEGATIVE mg/dL   Hgb urine dipstick MODERATE (*) NEGATIVE   Bilirubin Urine NEGATIVE  NEGATIVE   Ketones, ur NEGATIVE  NEGATIVE mg/dL   Protein, ur NEGATIVE  NEGATIVE mg/dL   Urobilinogen, UA 0.2  0.0 - 1.0 mg/dL   Nitrite NEGATIVE  NEGATIVE   Leukocytes, UA NEGATIVE  NEGATIVE  URINE MICROSCOPIC-ADD ON     Status: Abnormal   Collection Time    05/06/14 12:45 AM      Result Value Ref Range   Squamous Epithelial / LPF FEW (*) RARE   WBC, UA 0-2  <3 WBC/hpf   RBC / HPF 3-6  <3 RBC/hpf   Bacteria, UA RARE  RARE  CBC     Status: Abnormal   Collection Time    05/06/14  3:55 AM      Result Value Ref Range   WBC 12.0 (*) 4.0 - 10.5 K/uL   RBC 3.05 (*) 3.87 - 5.11 MIL/uL   Hemoglobin 9.7 (*) 12.0 - 15.0 g/dL   HCT 40.9 (*) 81.1 - 91.4 %   MCV 93.1  78.0 - 100.0 fL   MCH 31.8  26.0 -  34.0 pg   MCHC 34.2  30.0 - 36.0 g/dL   RDW 78.2  95.6 - 21.3 %   Platelets 136 (*) 150 - 400 K/uL         Meds: Fe, PNV  A: [redacted]w[redacted]d with partial previa     Stable     Fetal tracings: Category 1     Contractions: Occasional     Uterus non-tender      Extremities: WNL ` P: Continue current plan of care      Upcoming tests/treatments:  [place] for growth, placenta position and integrity       MDs will follow      Consulted with Dr Waymond Cera Infant Doane, CNM, MSN 05/06/2014. 6:44 AM

## 2014-05-06 NOTE — Progress Notes (Signed)
Venus Standard CNM

## 2014-05-06 NOTE — Plan of Care (Signed)
Problem: Consults Goal: Birthing Suites Patient Information Press F2 to bring up selections list   Pt < [redacted] weeks EGA     

## 2014-05-06 NOTE — Progress Notes (Addendum)
Addendum Results for orders placed during the hospital encounter of 05/06/14 (from the past 24 hour(s))  URINALYSIS, ROUTINE W REFLEX MICROSCOPIC     Status: Abnormal   Collection Time    05/06/14 12:45 AM      Result Value Ref Range   Color, Urine YELLOW  YELLOW   APPearance CLEAR  CLEAR   Specific Gravity, Urine 1.010  1.005 - 1.030   pH 6.0  5.0 - 8.0   Glucose, UA NEGATIVE  NEGATIVE mg/dL   Hgb urine dipstick MODERATE (*) NEGATIVE   Bilirubin Urine NEGATIVE  NEGATIVE   Ketones, ur NEGATIVE  NEGATIVE mg/dL   Protein, ur NEGATIVE  NEGATIVE mg/dL   Urobilinogen, UA 0.2  0.0 - 1.0 mg/dL   Nitrite NEGATIVE  NEGATIVE   Leukocytes, UA NEGATIVE  NEGATIVE  URINE MICROSCOPIC-ADD ON     Status: Abnormal   Collection Time    05/06/14 12:45 AM      Result Value Ref Range   Squamous Epithelial / LPF FEW (*) RARE   WBC, UA 0-2  <3 WBC/hpf   RBC / HPF 3-6  <3 RBC/hpf   Bacteria, UA RARE  RARE     SP IVF ctx spaced out to 8-15 minutes Consulted with Dr. Charlotta Newton Give Terbutaline  Admit for 23 hour obs CBC IVF at 125cc/hr   Kadance Mccuistion, CNM, MSN 05/06/2014. 3:46 AM

## 2014-05-06 NOTE — Progress Notes (Signed)
POC to start magnesium explained to pt and fob.

## 2014-05-06 NOTE — Progress Notes (Signed)
V. Standard CNM in to discuss admission and plan of care.

## 2014-05-06 NOTE — Progress Notes (Signed)
In to round on pt, pt states she has nausea, chest pain, and back pain, pulse ox applied and breath sounds clear. DTR unchanged at 1+ will notify CNM. Pt calm, FOB at [facility].

## 2014-05-06 NOTE — Progress Notes (Signed)
S: Tired. Pain in epigastric region resolved w/ GI cocktail. Reports active fetus. Denies VB, LOF or feeling ctxs. Tolerating regular diet w/o difficulty.  O: BP range 86-112/43-65; Pulse 88-109; afebrile.       Magnesium level 3.7; drawn prior to 6-hr mark due to c/o epigastric/chest pain.      Uterine irritability, occ ctxs noted.  A: Epigastric discomfort likely GERD; now resolved.     Suspect low BP and elevated pulse secondary to compensation due to Magnesium Sulfate therapy.     Reassurring FHRT for this GA.     Stable Magnesium level.     PTCs.  P: We discussed rationale for 24hr Magnesium Sulfate therapy, esp in light of anterior previa. Pt and spouse      acknowledged understanding.      Continue current plan.      CTO closely.   Sherre Scarlet, CNM 05/06/14 @ 9:51 PM

## 2014-05-06 NOTE — Progress Notes (Signed)
Venus Standard CNM notified of pt's admission and status. Aware of u/a results and uterine activity. Will see pt

## 2014-05-06 NOTE — MAU Provider Note (Signed)
Megan Johnson is a 28 y.o. G1P0 at 34.2 weeks with a know previa presents to MAU c/o lower abd pain x 3 days,  (some mild, some hard).  She denies, vb, lof, sex or increased activity.  +FM.  Pt declined [place] placenta placements and has already schedule a primary CS.  MAU visit on 8/18 with white discharge, per pt it cultured negative.   History     Patient Active Problem List   Diagnosis Date Noted  . Vaginal discharge, non-hemorrhagic 04/28/2014  . Abdominal cramping affecting pregnancy 04/28/2014  . Preterm uterine contractions in third trimester, antepartum 04/28/2014  . Placenta previa 03/01/2014  . Hemorrhoid 03/01/2014    Chief Complaint  Patient presents with  . Abdominal Pain   HPI  OB History   Grav Para Term Preterm Abortions TAB SAB Ect Mult Living   1               Past Medical History  Diagnosis Date  . Medical history non-contributory     Past Surgical History  Procedure Laterality Date  . No past surgeries      Family History  Problem Relation Age of Onset  . Alcohol abuse Neg Hx   . Arthritis Neg Hx   . Asthma Neg Hx   . Birth defects Neg Hx   . Cancer Neg Hx   . COPD Neg Hx   . Depression Neg Hx   . Diabetes Neg Hx   . Drug abuse Neg Hx   . Early death Neg Hx   . Hearing loss Neg Hx   . Heart disease Neg Hx   . Hyperlipidemia Neg Hx   . Kidney disease Neg Hx   . Hypertension Neg Hx   . Learning disabilities Neg Hx   . Mental illness Neg Hx   . Mental retardation Neg Hx   . Miscarriages / Stillbirths Neg Hx   . Stroke Neg Hx   . Vision loss Neg Hx   . Varicose Veins Neg Hx     History  Substance Use Topics  . Smoking status: Never Smoker   . Smokeless tobacco: Not on file  . Alcohol Use: No    Allergies: No Known Allergies  Prescriptions prior to admission  Medication Sig Dispense Refill  . acetaminophen (TYLENOL) 500 MG tablet Take 1,000 mg by mouth every 6 (six) hours as needed for mild pain.      . Prenatal Vit-Fe Fumarate-FA  (PRENATAL MULTIVITAMIN) TABS tablet Take 1 tablet by mouth daily.         ROS See HPI above, all other systems are negative  Physical Exam   Blood pressure 102/55, pulse 92, temperature 98.7 F (37.1 C), resp. rate 20, height  (1.499 m), weight 148 lb 12.8 oz (67.495 kg), last menstrual period 09/08/2013.  Physical Exam Ext:  WNL ABD: Soft, non tender to palpation, no rebound or guarding SSE: cervix is red but not bleeding and closed, white creamy discharge noted   ED Course  Assessment: IUP at  34.2weeks, anterior placenta previa Membranes: intact FHR: Category 1 CTX:  Mild at 4 minutes when she first arrived now occassional   Plan: Observe for 1 hours IVF Consult with Dr. Waymond Cera Mahala Rommel, CNM, MSN 05/06/2014. 1:44 AM

## 2014-05-06 NOTE — Progress Notes (Signed)
UR completed 

## 2014-05-06 NOTE — Progress Notes (Signed)
Report called to [facility] in Antenatal. Pt to antenatal via w/c

## 2014-05-06 NOTE — MAU Note (Addendum)
Pain in lower abdomen for couple of days. Comes and goes. Denies bleeding or LOF. Pt states has placenta previa

## 2014-05-06 NOTE — Progress Notes (Signed)
Dr. Su Hilt in to discuss plan for d/c today on BR. EFM now showing UCs q 4-6 min in last 30 min.   Patient unaware of UCs. No bleeding. FHR Category 1.  Per consult with Dr. Su Hilt, will give Procardia 10 mg po q 20 min prn persistent contractions, up to 4 doses. Will defer d/c at present. Patient and husband agreeable with plan. Will reassess later today.  Nigel Bridgeman, CNM 05/06/14 1105

## 2014-05-06 NOTE — Plan of Care (Signed)
Problem: Consults Goal: Birthing Suites Patient Information Press F2 to bring up selections list  Outcome: Progressing  Antenatal Patient (< 37 weeks)  Problem: Phase I Progression Outcomes Goal: Contractions < 5-6/hour Outcome: Not Progressing Magnesium started for worsening contractions.

## 2014-05-06 NOTE — Progress Notes (Signed)
VLatham,cnm notified of pt's c/o nausea, chest pain, and back pain and physical findings WNL. Orders received.

## 2014-05-07 DIAGNOSIS — O4700 False labor before 37 completed weeks of gestation, unspecified trimester: Secondary | ICD-10-CM | POA: Diagnosis present

## 2014-05-07 DIAGNOSIS — K219 Gastro-esophageal reflux disease without esophagitis: Secondary | ICD-10-CM | POA: Diagnosis present

## 2014-05-07 DIAGNOSIS — Z8751 Personal history of pre-term labor: Secondary | ICD-10-CM | POA: Diagnosis not present

## 2014-05-07 DIAGNOSIS — O4400 Placenta previa specified as without hemorrhage, unspecified trimester: Secondary | ICD-10-CM | POA: Diagnosis present

## 2014-05-07 DIAGNOSIS — O99019 Anemia complicating pregnancy, unspecified trimester: Secondary | ICD-10-CM | POA: Diagnosis present

## 2014-05-07 DIAGNOSIS — D649 Anemia, unspecified: Secondary | ICD-10-CM | POA: Diagnosis present

## 2014-05-07 DIAGNOSIS — O322XX0 Maternal care for transverse and oblique lie, not applicable or unspecified: Secondary | ICD-10-CM | POA: Diagnosis present

## 2014-05-07 MED ORDER — ZOLPIDEM TARTRATE 5 MG PO TABS: 5.0000 mg | ORAL_TABLET | Freq: Every evening | ORAL | Status: DC | PRN

## 2014-05-07 MED ORDER — ACETAMINOPHEN 325 MG PO TABS: 650.0000 mg | ORAL_TABLET | ORAL | Status: DC | PRN

## 2014-05-07 MED ORDER — CALCIUM CARBONATE ANTACID 500 MG PO CHEW: 2.0000 | CHEWABLE_TABLET | ORAL | Status: DC | PRN

## 2014-05-07 MED ORDER — FERROUS SULFATE 325 (65 FE) MG PO TABS: 325.0000 mg | ORAL_TABLET | Freq: Two times a day (BID) | ORAL | Status: DC

## 2014-05-07 MED ORDER — PRENATAL MULTIVITAMIN CH: 1.0000 | ORAL_TABLET | Freq: Every day | ORAL | Status: DC

## 2014-05-07 MED ORDER — DOCUSATE SODIUM 100 MG PO CAPS
100.0000 mg | ORAL_CAPSULE | Freq: Every day | ORAL | Status: DC
  Administered 2014-05-07: 100 mg via ORAL
  Filled 2014-05-07: qty 1

## 2014-05-07 NOTE — Progress Notes (Signed)
Hospital day # 1 pregnancy at [redacted]w[redacted]d--Anterior previa, PT contractions.  S:  Feeling better this am--no chest pain, SOB, N/V, or any other c/o.      Perception of contractions: None      Vaginal bleeding: None       Vaginal discharge:  None       Episode of likely reflux last night around 7pm--resolved with GI cocktail.      Magnesium level 3.7  O: BP 100/60  Pulse 97  Temp(Src) 98.6 F (37 C) (Oral)  Resp 18  Ht  (1.499 m)  Wt 148 lb 12.8 oz (67.495 kg)  BMI 30.04 kg/m2  SpO2 94%  LMP 09/08/2013      Fetal tracings:  Category 1      Contractions:   q 12-15 min, occasional irritability      Uterus non-tender      Extremities: no significant edema and no signs of DVT       Magnesium sulfate initiated at 1520 due to persistent UCs          Labs:   Results for orders placed during the hospital encounter of 05/06/14 (from the past 24 hour(s))  MAGNESIUM     Status: Abnormal   Collection Time    05/06/14  7:05 PM      Result Value Ref Range   Magnesium 3.7 (*) 1.5 - 2.5 mg/dL         Meds:  . ferrous sulfate  325 mg Oral BID WC  . ondansetron (ZOFRAN) IV  4 mg Intravenous 4 times per day  . prenatal multivitamin  1 tablet Oral Q1200     A: [redacted]w[redacted]d with anterior previa, persistent UCs     No bleeding  P: Continue current plan of care      Consulted with Dr. Donzetta Starch increased to 2.5 g/hr.      Continue magnesium x 24 hours (until 1520 today), then evaluate UC               status.      Upcoming tests/treatments:  None      MDs will follow  Nigel Bridgeman CNM, [place] 05/07/2014 10:12 AM

## 2014-05-07 NOTE — Progress Notes (Signed)
Now at 24 hours of magnesium sulfate therapy. UCs q 15 min, mild, patient unaware of these. No bleeding. FHR Category 1  Will d/c magnesium and observe UC activity.  Nigel Bridgeman, CNM 05/07/14 3:30p

## 2014-05-07 NOTE — Discharge Summary (Signed)
Physician Discharge Summary  Patient ID: Megan Johnson MRN: [id number] DOB/AGE: 27-Jun-1986 61 y.o.  Admit date: 05/06/2014 Discharge date: 05/07/2014  Admission Diagnoses:  IUP at 34 1/7 weeks, placenta previa, preterm contractions  Discharge Diagnoses:  Active Problems:   Placenta previa   Preterm labor   Discharged Condition: stable  Hospital Course: Admitted early am 05/06/14 due to lower abdominal cramping x 3 days with known previa, with contractions documented.   She had no bleeding. Terbutaline x 1 dose was given in MAU with resolution of UCs.  Cervix closed on speculum exam.  Patient was stable overnight, with d/c anticipated, but then she began contracting.  Procardia regimen was implemented, but contractions continued.  She was begun on Magnesium sulfate on the afternoon of 05/06/14 and continued for 24 hours.  Mag was d/c'd on the afternoon of 9/7--the patient was observed x 3 hours, with UCs noted to be 2-4/hour and painless.  Patient and husband desired d/c--Dr. Su Hilt was consulted and OK'd d/c.  Patient has remained without any evidence of bleeding.  Patient to be on bedrest at home, on pelvic rest, to return with any bleeding, increased contractions, or any painful contractions.  Patient and husband live 18 min away and have reliable transportation.  She will have a referral visit scheduled with MFM this week to review the issue of previa with persistent contractions and timing of delivery.  Office will call to schedule this.  Patient will be OOW for the duration of pregnancy.  Consults: None  Significant Diagnostic Studies: radiology: Ultrasound: Funic presentation, transverse position, head on maternal right, EFW 2529 gm, 5+9, 71%ile, AFI 9.4, 15%ile, subjectively low-normal fluid.  Treatments: IV hydration, Terbutaline, Procardia, magnesium sulfate x 24 hours.  Discharge Exam: Blood pressure 104/67, pulse 97, temperature 98.2 F (36.8 C), temperature source Oral, resp. rate 18,  height  (1.499 m), weight 148 lb 12.8 oz (67.495 kg), last menstrual period 09/08/2013, SpO2 94.00%. General appearance: alert Cardio: regular rate and rhythm, S1, S2 normal, no murmur, click, rub or gallop Pelvic: uterus normal size, shape, and consistency and vagina normal without discharge Extremities: extremities normal, atraumatic, no cyanosis or edema  Disposition: 01-Home or Self Care     Medication List         acetaminophen 500 MG tablet  Commonly known as:  TYLENOL  Take 1,000 mg by mouth every 6 (six) hours as needed for mild pain.     ferrous sulfate 325 (65 FE) MG tablet  Take 1 tablet (325 mg total) by mouth 2 (two) times daily with a meal.     prenatal multivitamin Tabs tablet  Take 1 tablet by mouth daily.           Follow-up Information   Follow up with [facility]. (Office will call to schedule your consultation visit with Maternal Fetal Medicine this week.)    Specialty:  Obstetrics and Gynecology   Contact information:   [address] [place] [address] [phone]      Signed: Ray Church 05/07/2014, 6:31 PM

## 2014-05-07 NOTE — Progress Notes (Signed)
Ur chart review completed.  

## 2014-05-07 NOTE — Progress Notes (Signed)
Discharge instructions given to pt and FOB by Nigel Bridgeman CNM and pt verbalizes understanding.  Pt taken off the monitor after reasurring FHR

## 2014-05-07 NOTE — Discharge Instructions (Signed)
Placenta Previa   Placenta previa is a condition in pregnant women where the placenta implants in the lower part of the uterus. The placenta either partially or completely covers the opening to the cervix. This is a problem because the baby must pass through the cervix during delivery. There are three types of placenta previa. They include:   1. Marginal placenta previa. The placenta is near the cervix, but does not cover the opening.  2. Partial placenta previa. The placenta covers part of the cervical opening.  3. Complete placenta previa. The placenta covers the entire cervical opening.    Depending on the type of placenta previa, there is a chance the placenta may move into a normal position and no longer cover the cervix as the pregnancy progresses. It is important to keep all prenatal visits with your caregiver.   RISK FACTORS  You may be more likely to develop placenta previa if you:   · Are carrying more than one baby (multiples).    · Have an abnormally shaped uterus.    · Have scars on the lining of the uterus.    · Had previous surgeries involving the uterus, such as a cesarean delivery.    · Have delivered a baby previously.    · Have a history of placenta previa.    · Have smoked or used cocaine during pregnancy.    · Are age 35 or older during pregnancy.    SYMPTOMS  The main symptom of placenta previa is sudden, painless vaginal bleeding during the second half of pregnancy. The amount of bleeding can be light to very heavy. The bleeding may stop on its own, but almost always returns. Cramping, regular contractions, abdominal pain, and lower back pain can also occur with placenta previa.   DIAGNOSIS  Placenta previa can be diagnosed through an ultrasound by finding where the placenta is located. The ultrasound may find placenta previa either during a routine prenatal visit or after vaginal bleeding is noticed. If you are diagnosed with placenta previa, your caregiver may avoid vaginal exams to reduce  the risk of heavy bleeding. There is a chance that placenta previa may not be diagnosed until bleeding occurs during labor.   TREATMENT  Specific treatment depends on:   · How much you are bleeding or if the bleeding has stopped.  · How far along you are in your pregnancy.    · The condition of the baby.    · The location of the baby and placenta.    · The type of placenta previa.    Depending on the factors above, your caregiver may recommend:   · Decreased activity.    · Bed rest at home or in the hospital.  · Pelvic rest. This means no sex, using tampons, douching, pelvic exams, or placing anything into the vagina.  · A blood transfusion to replace maternal blood loss.  · A cesarean delivery if the bleeding is heavy and cannot be controlled or the placenta completely covers the cervix.  · Medication to stop premature labor or mature the fetal lungs if delivery is needed before the pregnancy is full term.    WHEN SHOULD YOU SEEK IMMEDIATE MEDICAL CARE IF YOU ARE SENT HOME WITH PLACENTA PREVIA?  Seek immediate medical care if you show any symptoms of placenta previa. You will need to go to the hospital to get checked immediately. Again, those symptoms are:  · Sudden, painless vaginal bleeding, even a small amount.  · Cramping or regular contractions.  · Lower back or abdominal pain.  Document Released: 08/17/2005   Document Revised: 04/19/2013 Document Reviewed: 11/18/2012  ExitCare® Patient Information ©2015 [employer]. This information is not intended to replace advice given to you by your health care provider. Make sure you discuss any questions you have with your health care provider.

## 2014-05-10 ENCOUNTER — Encounter (HOSPITAL_COMMUNITY): Payer: Self-pay | Admitting: *Deleted

## 2014-05-10 ENCOUNTER — Inpatient Hospital Stay (HOSPITAL_COMMUNITY)
Admission: AD | Admit: 2014-05-10 | Discharge: 2014-05-11 | Disposition: A | Discharge status: Home / Self Care | Payer: 59 | Source: Ambulatory Visit | Attending: Obstetrics and Gynecology | Admitting: Obstetrics and Gynecology

## 2014-05-10 DIAGNOSIS — O4400 Placenta previa specified as without hemorrhage, unspecified trimester: Secondary | ICD-10-CM | POA: Insufficient documentation

## 2014-05-10 DIAGNOSIS — O4700 False labor before 37 completed weeks of gestation, unspecified trimester: Secondary | ICD-10-CM | POA: Insufficient documentation

## 2014-05-10 NOTE — MAU Note (Signed)
Pt reports uc's since 1530. Denies LOF or Vaginal Bleeding. + FM

## 2014-05-11 ENCOUNTER — Ambulatory Visit (HOSPITAL_COMMUNITY)
Admission: RE | Admit: 2014-05-11 | Discharge: 2014-05-11 | Disposition: A | Discharge status: Home / Self Care | Payer: 59 | Source: Ambulatory Visit | Attending: Obstetrics and Gynecology | Admitting: Obstetrics and Gynecology

## 2014-05-11 VITALS — BP 112/72 | HR 97 | Wt 151.0 lb

## 2014-05-11 DIAGNOSIS — O99019 Anemia complicating pregnancy, unspecified trimester: Secondary | ICD-10-CM | POA: Diagnosis not present

## 2014-05-11 DIAGNOSIS — D649 Anemia, unspecified: Secondary | ICD-10-CM | POA: Diagnosis not present

## 2014-05-11 DIAGNOSIS — O4700 False labor before 37 completed weeks of gestation, unspecified trimester: Secondary | ICD-10-CM | POA: Diagnosis not present

## 2014-05-11 DIAGNOSIS — O4403 Placenta previa specified as without hemorrhage, third trimester: Secondary | ICD-10-CM

## 2014-05-11 DIAGNOSIS — O4400 Placenta previa specified as without hemorrhage, unspecified trimester: Secondary | ICD-10-CM | POA: Insufficient documentation

## 2014-05-11 DIAGNOSIS — O4703 False labor before 37 completed weeks of gestation, third trimester: Secondary | ICD-10-CM

## 2014-05-11 LAB — URINALYSIS, ROUTINE W REFLEX MICROSCOPIC
Bilirubin Urine: NEGATIVE
GLUCOSE, UA: NEGATIVE mg/dL
Ketones, ur: NEGATIVE mg/dL
Leukocytes, UA: NEGATIVE
NITRITE: NEGATIVE
PH: 7 (ref 5.0–8.0)
Protein, ur: NEGATIVE mg/dL
Specific Gravity, Urine: 1.015 (ref 1.005–1.030)
Urobilinogen, UA: 0.2 mg/dL (ref 0.0–1.0)

## 2014-05-11 LAB — WET PREP, GENITAL
Clue Cells Wet Prep HPF POC: NONE SEEN
Trich, Wet Prep: NONE SEEN
YEAST WET PREP: NONE SEEN

## 2014-05-11 LAB — URINE MICROSCOPIC-ADD ON

## 2014-05-11 MED ORDER — TERBUTALINE SULFATE 1 MG/ML IJ SOLN
0.2500 mg | Freq: Once | INTRAMUSCULAR | Status: AC
  Administered 2014-05-11: 0.25 mg via SUBCUTANEOUS
  Filled 2014-05-11: qty 1

## 2014-05-11 NOTE — MAU Provider Note (Signed)
History   Patient is a 28 y.o. G1P0 who presents unannounced for contractions.  Patient with placenta previa and reports recent discharge, Monday 05/07/14, from inpatient stay.  Patient states contractions have been ongoing and reports having 9 in the last 45 minutes.  Patient denies LOF, VB, and reports active fetus.  Patient denies GI issues including N/V, diarrhea, but reports ongoing issues with constipation.  Patient also denies issues with urination.   Patient Active Problem List   Diagnosis Date Noted  . Preterm labor 05/06/2014  . Vaginal discharge, non-hemorrhagic 04/28/2014  . Abdominal cramping affecting pregnancy 04/28/2014  . Preterm uterine contractions in third trimester, antepartum 04/28/2014  . Placenta previa 03/01/2014  . Hemorrhoid 03/01/2014    Chief Complaint  Patient presents with  . Contractions   HPI  OB History   Grav Para Term Preterm Abortions TAB SAB Ect Mult Living   1               Past Medical History  Diagnosis Date  . Medical history non-contributory     Past Surgical History  Procedure Laterality Date  . No past surgeries      Family History  Problem Relation Age of Onset  . Alcohol abuse Neg Hx   . Arthritis Neg Hx   . Asthma Neg Hx   . Birth defects Neg Hx   . Cancer Neg Hx   . COPD Neg Hx   . Depression Neg Hx   . Diabetes Neg Hx   . Drug abuse Neg Hx   . Early death Neg Hx   . Hearing loss Neg Hx   . Heart disease Neg Hx   . Hyperlipidemia Neg Hx   . Kidney disease Neg Hx   . Hypertension Neg Hx   . Learning disabilities Neg Hx   . Mental illness Neg Hx   . Mental retardation Neg Hx   . Miscarriages / Stillbirths Neg Hx   . Stroke Neg Hx   . Vision loss Neg Hx   . Varicose Veins Neg Hx     History  Substance Use Topics  . Smoking status: Never Smoker   . Smokeless tobacco: Not on file  . Alcohol Use: No    Allergies: No Known Allergies  Prescriptions prior to admission  Medication Sig Dispense Refill  .  acetaminophen (TYLENOL) 500 MG tablet Take 1,000 mg by mouth every 6 (six) hours as needed for mild pain.      . ferrous sulfate 325 (65 FE) MG tablet Take 1 tablet (325 mg total) by mouth 2 (two) times daily with a meal.  60 tablet  3  . Prenatal Vit-Fe Fumarate-FA (PRENATAL MULTIVITAMIN) TABS tablet Take 1 tablet by mouth daily.         ROS  See HPI Above Physical Exam   Blood pressure 101/61, pulse 96, temperature 96 F (35.6 C), temperature source Oral, resp. rate 18, last menstrual period 09/08/2013. Results for orders placed during the hospital encounter of 05/10/14 (from the past 24 hour(s))  URINALYSIS, ROUTINE W REFLEX MICROSCOPIC     Status: Abnormal   Collection Time    05/10/14 10:37 PM      Result Value Ref Range   Color, Urine YELLOW  YELLOW   APPearance CLEAR  CLEAR   Specific Gravity, Urine 1.015  1.005 - 1.030   pH 7.0  5.0 - 8.0   Glucose, UA NEGATIVE  NEGATIVE mg/dL   Hgb urine dipstick MODERATE (*) NEGATIVE  Bilirubin Urine NEGATIVE  NEGATIVE   Ketones, ur NEGATIVE  NEGATIVE mg/dL   Protein, ur NEGATIVE  NEGATIVE mg/dL   Urobilinogen, UA 0.2  0.0 - 1.0 mg/dL   Nitrite NEGATIVE  NEGATIVE   Leukocytes, UA NEGATIVE  NEGATIVE  URINE MICROSCOPIC-ADD ON     Status: Abnormal   Collection Time    05/10/14 10:37 PM      Result Value Ref Range   Squamous Epithelial / LPF FEW (*) RARE   WBC, UA 0-2  <3 WBC/hpf   RBC / HPF 3-6  <3 RBC/hpf   Bacteria, UA RARE  RARE  WET PREP, GENITAL     Status: Abnormal   Collection Time    05/11/14 12:20 AM      Result Value Ref Range   Yeast Wet Prep HPF POC NONE SEEN  NONE SEEN   Trich, Wet Prep NONE SEEN  NONE SEEN   Clue Cells Wet Prep HPF POC NONE SEEN  NONE SEEN   WBC, Wet Prep HPF POC FEW (*) NONE SEEN    Physical Exam  Constitutional: She is oriented to person, place, and time. She appears well-developed and well-nourished.  Respiratory: Effort normal.  GI: Soft.  Appears gravid--fundal height appropriate for GA.  Soft, NT   Genitourinary: Rectum normal. Vaginal discharge found.  Sterile Speculum Exam: -Vaginal Vault: White plaques noted on vaginal walls-wet prep collected -Cervix: Thick white discharge from os-Appears closed  Bimanual Exam: Deferred  Neurological: She is alert and oriented to person, place, and time.  Skin: Skin is warm and dry.    FHR: 145 bpm, Mod Var, -Decels, +Accels UC: Q1-88min, palpates mild to moderate ED Course  Assessment: IUP at 35wks Cat I FT Placenta Previa Contractions  Plan: -Labs: UA, Wet Prep -Terbutaline SQ  Follow Up (0139) -Contractions not graphed or palpated -Will continue to observe for s/p 1 hour after terb injection -Labs: Negative  Follow Up (0208) -Occasional contraction noted -Patient reports appt with MFM in am and in office on Wednesday -Bleeding and Labor Precautions Discussed/Reviewed -Keep all appts as scheduled -Call if you have any questions or concerns prior to your next visit.   Nayeli Calvert LYNN CNM, MSN 05/11/2014 12:23 AM

## 2014-05-11 NOTE — Discharge Instructions (Signed)
Placenta Previa   Placenta previa is a condition in pregnant women where the placenta implants in the lower part of the uterus. The placenta either partially or completely covers the opening to the cervix. This is a problem because the baby must pass through the cervix during delivery. There are three types of placenta previa. They include:   1. Marginal placenta previa. The placenta is near the cervix, but does not cover the opening.  2. Partial placenta previa. The placenta covers part of the cervical opening.  3. Complete placenta previa. The placenta covers the entire cervical opening.    Depending on the type of placenta previa, there is a chance the placenta may move into a normal position and no longer cover the cervix as the pregnancy progresses. It is important to keep all prenatal visits with your caregiver.   RISK FACTORS  You may be more likely to develop placenta previa if you:   · Are carrying more than one baby (multiples).    · Have an abnormally shaped uterus.    · Have scars on the lining of the uterus.    · Had previous surgeries involving the uterus, such as a cesarean delivery.    · Have delivered a baby previously.    · Have a history of placenta previa.    · Have smoked or used cocaine during pregnancy.    · Are age 35 or older during pregnancy.    SYMPTOMS  The main symptom of placenta previa is sudden, painless vaginal bleeding during the second half of pregnancy. The amount of bleeding can be light to very heavy. The bleeding may stop on its own, but almost always returns. Cramping, regular contractions, abdominal pain, and lower back pain can also occur with placenta previa.   DIAGNOSIS  Placenta previa can be diagnosed through an ultrasound by finding where the placenta is located. The ultrasound may find placenta previa either during a routine prenatal visit or after vaginal bleeding is noticed. If you are diagnosed with placenta previa, your caregiver may avoid vaginal exams to reduce  the risk of heavy bleeding. There is a chance that placenta previa may not be diagnosed until bleeding occurs during labor.   TREATMENT  Specific treatment depends on:   · How much you are bleeding or if the bleeding has stopped.  · How far along you are in your pregnancy.    · The condition of the baby.    · The location of the baby and placenta.    · The type of placenta previa.    Depending on the factors above, your caregiver may recommend:   · Decreased activity.    · Bed rest at home or in the hospital.  · Pelvic rest. This means no sex, using tampons, douching, pelvic exams, or placing anything into the vagina.  · A blood transfusion to replace maternal blood loss.  · A cesarean delivery if the bleeding is heavy and cannot be controlled or the placenta completely covers the cervix.  · Medication to stop premature labor or mature the fetal lungs if delivery is needed before the pregnancy is full term.    WHEN SHOULD YOU SEEK IMMEDIATE MEDICAL CARE IF YOU ARE SENT HOME WITH PLACENTA PREVIA?  Seek immediate medical care if you show any symptoms of placenta previa. You will need to go to the hospital to get checked immediately. Again, those symptoms are:  · Sudden, painless vaginal bleeding, even a small amount.  · Cramping or regular contractions.  · Lower back or abdominal pain.  Document Released: 08/17/2005   Document Revised: 04/19/2013 Document Reviewed: 11/18/2012  ExitCare® Patient Information ©2015 [employer]. This information is not intended to replace advice given to you by your health care provider. Make sure you discuss any questions you have with your health care provider.

## 2014-05-11 NOTE — Consult Note (Signed)
Maternal Fetal Medicine Consultation  Requesting Provider(s): Erin Sons, MD  Reason for consultation: Placenta previa - recommendations for management  HPI: Megan Johnson is a 28 yo G1P0 EDD 06/15/2014 who is currently at 35w 0d seen for consultation due to anterior placenta previa.  The patient reports that she was first diagnosed with placenta previa during her 20 week ultrasound.  She underwent a limited ultrasound in [facility] on 7 September that again showed an anterior  Placenta previa.  Of note, a "loop of cord" was noted in the lower uterine segment, near the internal os. Over the last several weeks, she has been seen on multiple occasions due to preterm contractions.  She was seen last night due to preterm contractions that resolved with IV hydration and a single dose of SQ terbutaline.  She is currently scheduled for C-section at 37 weeks. She denies any vaginal bleeding now or at any time.  The fetus is active.  OB History: OB History   Grav Para Term Preterm Abortions TAB SAB Ect Mult Living   1               PMH:  Past Medical History  Diagnosis Date  . Medical history non-contributory     PSH:  Past Surgical History  Procedure Laterality Date  . No past surgeries     Meds:  Current Outpatient Prescriptions on File Prior to Encounter  Medication Sig Dispense Refill  . acetaminophen (TYLENOL) 500 MG tablet Take 1,000 mg by mouth every 6 (six) hours as needed for mild pain.      . ferrous sulfate 325 (65 FE) MG tablet Take 1 tablet (325 mg total) by mouth 2 (two) times daily with a meal.  60 tablet  3  . Prenatal Vit-Fe Fumarate-FA (PRENATAL MULTIVITAMIN) TABS tablet Take 1 tablet by mouth daily.        No current facility-administered medications on file prior to encounter.   Allergies: No Known Allergies  FH:  Family History  Problem Relation Age of Onset  . Alcohol abuse Neg Hx   . Arthritis Neg Hx   . Asthma Neg Hx   . Birth defects Neg Hx   . Cancer Neg Hx   .  COPD Neg Hx   . Depression Neg Hx   . Diabetes Neg Hx   . Drug abuse Neg Hx   . Early death Neg Hx   . Hearing loss Neg Hx   . Heart disease Neg Hx   . Hyperlipidemia Neg Hx   . Kidney disease Neg Hx   . Hypertension Neg Hx   . Learning disabilities Neg Hx   . Mental illness Neg Hx   . Mental retardation Neg Hx   . Miscarriages / Stillbirths Neg Hx   . Stroke Neg Hx   . Vision loss Neg Hx   . Varicose Veins Neg Hx   Denies family history of birth defects or hereditary disorders  Soc:  History   Social History  . Marital Status: Married    Spouse Name: N/A    Number of Children: N/A  . Years of Education: N/A   Occupational History  . Not on file.   Social History Main Topics  . Smoking status: Never Smoker   . Smokeless tobacco: Not on file  . Alcohol Use: No  . Drug Use: No  . Sexual Activity: Yes     Comment: preg   Other Topics Concern  . Not on file   Social  History Narrative  . No narrative on file    PE:   Filed Vitals:   05/11/14 0914  BP: 112/72  Pulse: 97    GEN: well-appearing female ABD: gravid, NT  Labs: CBC    Component Value Date/Time   WBC 12.0* 05/06/2014 0355   RBC 3.05* 05/06/2014 0355   HGB 9.7* 05/06/2014 0355   HCT 28.4* 05/06/2014 0355   PLT 136* 05/06/2014 0355   MCV 93.1 05/06/2014 0355   MCH 31.8 05/06/2014 0355   MCHC 34.2 05/06/2014 0355   RDW 12.6 05/06/2014 0355     A/P: 1) Single IUP at [redacted]w[redacted]d          2) Anterior placenta previa - had long discussion regarding route of delivery and recommendations for scheduled C-section at 37 weeks in the absence of other complications.  Given the patient's current gestation, would proceed with Cesarean delivery if the patient has any vaginal bleeding or spotting.  The patient's history of frequent uterine preterm contractions is concerning - and the decision to proceed with C-section may ultimately be a clinical judgement as a digital cervical exam is not possible in light of placenta previa.           3) Anemia - continue iron supplementation, particularly in light of placenta previa  Recommendations: 1) If the patient is otherwise stable, recommend scheduled C-section at 37 weeks 2) Would proceed with Cesarean section should the patient experience any vaginal bleeding at this point, given her current gestational age 42) The decision to proceed with Cesarean section for labor will ultimately be a clinical judgement.  If the patient has frequent uterine contractions over several hours that do not resolve with IV hydration and conservative measures, would consider moving toward Cesarean section.   Thank you for the opportunity to be a part of the care of [employer]. Please contact our office if we can be of further assistance.   I spent approximately 30 minutes with this patient with over 50% of time spent in face-to-face counseling.  Alpha Gula, MD Maternal Fetal Medicine

## 2014-05-17 ENCOUNTER — Encounter (HOSPITAL_COMMUNITY): Payer: Self-pay | Admitting: Pharmacist

## 2014-05-18 ENCOUNTER — Other Ambulatory Visit: Payer: Self-pay | Admitting: Obstetrics & Gynecology

## 2014-05-21 ENCOUNTER — Encounter (HOSPITAL_COMMUNITY): Payer: Self-pay

## 2014-05-22 ENCOUNTER — Encounter (HOSPITAL_COMMUNITY)
Admission: RE | Admit: 2014-05-22 | Discharge: 2014-05-22 | Disposition: A | Discharge status: Home / Self Care | Payer: 59 | Source: Ambulatory Visit | Attending: Obstetrics & Gynecology | Admitting: Obstetrics & Gynecology

## 2014-05-22 ENCOUNTER — Encounter (HOSPITAL_COMMUNITY): Payer: Self-pay

## 2014-05-22 LAB — CBC
HCT: 32.4 % — ABNORMAL LOW (ref 36.0–46.0)
Hemoglobin: 11 g/dL — ABNORMAL LOW (ref 12.0–15.0)
MCH: 32 pg (ref 26.0–34.0)
MCHC: 34 g/dL (ref 30.0–36.0)
MCV: 94.2 fL (ref 78.0–100.0)
Platelets: 154 10*3/uL (ref 150–400)
RBC: 3.44 MIL/uL — ABNORMAL LOW (ref 3.87–5.11)
RDW: 13.8 % (ref 11.5–15.5)
WBC: 10.5 10*3/uL (ref 4.0–10.5)

## 2014-05-22 LAB — ABO/RH: ABO/RH(D): O POS

## 2014-05-22 LAB — RPR

## 2014-05-22 NOTE — Patient Instructions (Addendum)
   Your procedure is scheduled on:  Thursday, Sept 24  Enter through the [facility] at: 8 AM Pick up the phone at the desk and dial [phone] and inform us of your arrival.  Please call this number if you have any problems the morning of surgery: [phone]  Remember: Do not eat or drink after midnight: Wednesday Take these medicines the morning of surgery with a SIP OF WATER:  None  Do not wear jewelry, make-up, or FINGER nail polish No metal in your hair or on your body. Do not wear lotions, powders, perfumes.  You may wear deodorant.  Do not bring valuables to the hospital. Contacts, dentures or bridgework may not be worn into surgery.  Leave suitcase in the car. After Surgery it may be brought to your room. For patients being admitted to the hospital, checkout time is 11:00am the day of discharge.  Home with husband Mikle Bosworth cell [phone]

## 2014-05-23 NOTE — H&P (Signed)
Megan Johnson is a 28 y.o. female presenting for a cesarean delivery on 05/24/14  due to placenta previa.  LMP 09/08/2013.  EDC 06/15/14 by LMP, confirmed by first trimester ultrasound.  EGA 36 W 6 D. Prenatal course significant for preterm contractions for which patient received tocolysis at about 34 w EGA.    History OB History   Grav Para Term Preterm Abortions TAB SAB Ect Mult Living   1              Past Medical History  Diagnosis Date  . Anemia    Past Surgical History  Procedure Laterality Date  . Wisdom tooth extraction     Family History: family history is negative for Alcohol abuse, Arthritis, Asthma, Birth defects, Cancer, COPD, Depression, Diabetes, Drug abuse, Early death, Hearing loss, Heart disease, Hyperlipidemia, Kidney disease, Hypertension, Learning disabilities, Mental illness, Mental retardation, Miscarriages / Stillbirths, Stroke, Vision loss, and Varicose Veins. Social History:  reports that she has never smoked. She has never used smokeless tobacco. She reports that she does not drink alcohol or use illicit drugs.   Prenatal Transfer Tool  Maternal Diabetes: No Genetic Screening: Normal Maternal Ultrasounds/Referrals: Abnormal:  Findings:   Other: Fetal Ultrasounds or other Referrals:  None Maternal Substance Abuse:  No Significant Maternal Medications:  Meds include: Other:  Significant Maternal Lab Results:  Lab values include: Other:  Other Comments:  Maternal ultrasound showed placenta previa, anterior placenta. Significant maternal medication include Vicodin, ferrous sulfate.  Significant maternal labs include: CBC on 9/22 showing WBS 10 HGB 11,, HCT 32, PLT 154.    ALLERGIES: NKDA.   ROS  GEN: Denies fevers/chills. CVS: Denies chest pain or palpitations.  PULM: Denies wheezing or coughing.  GI: Denies nausea, vomiting, diarrhea. GENITOURINARY: Denies vaginal bleeding or contractions or leakage of fluid.  Denies dysuria, urgency or frequency.    MUSCULOSKELETAL: Denies muscle or joint pain and aches.       Last menstrual period 09/08/2013. Exam Physical Exam  GEN: NAD CVS: s1, s2, RRR PULM: CTAB ABD: Soft/gravid/NT Ext: Warm and well perfuse.   Prenatal labs: ABO, Rh: --/--/O POS (09/22 1156) Antibody: NEG (09/22 1156) Rubella: Immune (03/05 0000) RPR: NON REAC (09/22 1156)  HBsAg: Negative (03/05 0000)  HIV: Non-reactive (03/05 0000)  GBS:  Not done   PELVIC ULTRASOUND: 05/06/14:   EFW 5 lb 9 oz (71st%), AFI 12, Placenta previa, anterior placenta.  TRANSVERSE presentation.   Assessment/Plan: 28 y/o G1P0 @ 20 W 6 D EGA with placenta previa, for cesarean delivery. We discussed risks, benefits and alternatives of the procedure, including risks of heavy bleeding and need for blood transfusion, patient would accept blood transfusion.  We also discussed risk of cesarean hysterectomy incase of excessive uncontrollable bleeding.  We discussed risks if infection and damage to organs.  All questions were answered.    [facility] 05/23/2014, 7:39 PM

## 2014-05-24 ENCOUNTER — Encounter (HOSPITAL_COMMUNITY): Payer: Self-pay | Admitting: Anesthesiology

## 2014-05-24 ENCOUNTER — Encounter (HOSPITAL_COMMUNITY): Payer: 59 | Admitting: Anesthesiology

## 2014-05-24 ENCOUNTER — Inpatient Hospital Stay (HOSPITAL_COMMUNITY)
Admission: RE | Admit: 2014-05-24 | Discharge: 2014-05-27 | DRG: 765 | Disposition: A | Discharge status: Home / Self Care | Payer: 59 | Attending: Obstetrics & Gynecology | Admitting: Obstetrics & Gynecology

## 2014-05-24 ENCOUNTER — Inpatient Hospital Stay (HOSPITAL_COMMUNITY): Payer: 59 | Admitting: Anesthesiology

## 2014-05-24 ENCOUNTER — Encounter (HOSPITAL_COMMUNITY)
Admission: RE | Disposition: A | Discharge status: Home / Self Care | Payer: Self-pay | Source: Home / Self Care | Attending: Obstetrics & Gynecology

## 2014-05-24 DIAGNOSIS — O322XX0 Maternal care for transverse and oblique lie, not applicable or unspecified: Secondary | ICD-10-CM | POA: Diagnosis present

## 2014-05-24 DIAGNOSIS — O4400 Placenta previa specified as without hemorrhage, unspecified trimester: Secondary | ICD-10-CM | POA: Diagnosis present

## 2014-05-24 DIAGNOSIS — O4410 Placenta previa with hemorrhage, unspecified trimester: Principal | ICD-10-CM | POA: Diagnosis present

## 2014-05-24 DIAGNOSIS — Z98891 History of uterine scar from previous surgery: Secondary | ICD-10-CM

## 2014-05-24 DIAGNOSIS — D649 Anemia, unspecified: Secondary | ICD-10-CM | POA: Diagnosis present

## 2014-05-24 DIAGNOSIS — O9902 Anemia complicating childbirth: Secondary | ICD-10-CM | POA: Diagnosis present

## 2014-05-24 LAB — CBC
HCT: 25.9 % — ABNORMAL LOW (ref 36.0–46.0)
HCT: 25.5 % — ABNORMAL LOW (ref 36.0–46.0)
HEMOGLOBIN: 8.7 g/dL — AB (ref 12.0–15.0)
HEMOGLOBIN: 8.8 g/dL — AB (ref 12.0–15.0)
MCH: 31.6 pg (ref 26.0–34.0)
MCH: 32.2 pg (ref 26.0–34.0)
MCHC: 33.6 g/dL (ref 30.0–36.0)
MCHC: 34.5 g/dL (ref 30.0–36.0)
MCV: 94.2 fL (ref 78.0–100.0)
MCV: 93.4 fL (ref 78.0–100.0)
PLATELETS: 161 10*3/uL (ref 150–400)
Platelets: 160 10*3/uL (ref 150–400)
RBC: 2.75 MIL/uL — ABNORMAL LOW (ref 3.87–5.11)
RBC: 2.73 MIL/uL — ABNORMAL LOW (ref 3.87–5.11)
RDW: 13.8 % (ref 11.5–15.5)
RDW: 13.7 % (ref 11.5–15.5)
WBC: 14.9 10*3/uL — ABNORMAL HIGH (ref 4.0–10.5)
WBC: 17.6 10*3/uL — ABNORMAL HIGH (ref 4.0–10.5)

## 2014-05-24 LAB — PREPARE RBC (CROSSMATCH)

## 2014-05-24 SURGERY — Surgical Case
Anesthesia: Epidural | Site: Abdomen

## 2014-05-24 MED ORDER — OXYTOCIN 40 UNITS IN LACTATED RINGERS INFUSION - SIMPLE MED
INTRAVENOUS | Status: DC | PRN
  Administered 2014-05-24: 40 [IU] via INTRAVENOUS

## 2014-05-24 MED ORDER — SIMETHICONE 80 MG PO CHEW
80.0000 mg | CHEWABLE_TABLET | ORAL | Status: DC
Start: 2014-05-25 — End: 2014-05-27
  Administered 2014-05-25 – 2014-05-26 (×3): 80 mg via ORAL
  Filled 2014-05-24 (×2): qty 1

## 2014-05-24 MED ORDER — ONDANSETRON HCL 4 MG/2ML IJ SOLN
INTRAMUSCULAR | Status: AC
  Filled 2014-05-24: qty 2

## 2014-05-24 MED ORDER — LACTATED RINGERS IV SOLN: INTRAVENOUS | Status: DC

## 2014-05-24 MED ORDER — SCOPOLAMINE 1 MG/3DAYS TD PT72
1.0000 | MEDICATED_PATCH | Freq: Once | TRANSDERMAL | Status: AC
Start: 2014-05-24 — End: 2014-05-27
  Administered 2014-05-24: 1.5 mg via TRANSDERMAL

## 2014-05-24 MED ORDER — FENTANYL CITRATE 0.05 MG/ML IJ SOLN
INTRAMUSCULAR | Status: DC | PRN
  Administered 2014-05-24: 25 ug via INTRATHECAL

## 2014-05-24 MED ORDER — DIBUCAINE 1 % RE OINT
1.0000 | TOPICAL_OINTMENT | RECTAL | Status: DC | PRN
Start: 2014-05-24 — End: 2014-05-27

## 2014-05-24 MED ORDER — DIPHENHYDRAMINE HCL 50 MG/ML IJ SOLN: 12.5000 mg | INTRAMUSCULAR | Status: DC | PRN

## 2014-05-24 MED ORDER — NALBUPHINE HCL 10 MG/ML IJ SOLN: 5.0000 mg | Freq: Once | INTRAMUSCULAR | Status: AC | PRN

## 2014-05-24 MED ORDER — MORPHINE SULFATE (PF) 0.5 MG/ML IJ SOLN
INTRAMUSCULAR | Status: DC | PRN
  Administered 2014-05-24: .1 mg via INTRATHECAL

## 2014-05-24 MED ORDER — SODIUM CHLORIDE 0.9 % IJ SOLN: 3.0000 mL | INTRAMUSCULAR | Status: DC | PRN

## 2014-05-24 MED ORDER — CEFAZOLIN SODIUM-DEXTROSE 2-3 GM-% IV SOLR
INTRAVENOUS | Status: AC
  Administered 2014-05-24: 2 g via INTRAVENOUS
  Filled 2014-05-24: qty 50

## 2014-05-24 MED ORDER — LACTATED RINGERS IV SOLN
Freq: Once | INTRAVENOUS | Status: AC
  Administered 2014-05-24: 09:00:00 via INTRAVENOUS

## 2014-05-24 MED ORDER — LANOLIN HYDROUS EX OINT: 1.0000 "application " | TOPICAL_OINTMENT | CUTANEOUS | Status: DC | PRN

## 2014-05-24 MED ORDER — SIMETHICONE 80 MG PO CHEW
80.0000 mg | CHEWABLE_TABLET | ORAL | Status: DC | PRN
  Filled 2014-05-24: qty 1

## 2014-05-24 MED ORDER — PRENATAL MULTIVITAMIN CH
1.0000 | ORAL_TABLET | Freq: Every day | ORAL | Status: DC
  Administered 2014-05-25 – 2014-05-27 (×3): 1 via ORAL
  Filled 2014-05-24 (×3): qty 1

## 2014-05-24 MED ORDER — MORPHINE SULFATE 0.5 MG/ML IJ SOLN
INTRAMUSCULAR | Status: AC
  Filled 2014-05-24: qty 10

## 2014-05-24 MED ORDER — PHENYLEPHRINE 8 MG IN D5W 100 ML (0.08MG/ML) PREMIX OPTIME
INJECTION | INTRAVENOUS | Status: AC
  Filled 2014-05-24: qty 100

## 2014-05-24 MED ORDER — LACTATED RINGERS IV SOLN
INTRAVENOUS | Status: DC
  Administered 2014-05-24 (×2): via INTRAVENOUS

## 2014-05-24 MED ORDER — METOCLOPRAMIDE HCL 5 MG/ML IJ SOLN: 10.0000 mg | Freq: Once | INTRAMUSCULAR | Status: DC | PRN

## 2014-05-24 MED ORDER — NALOXONE HCL 0.4 MG/ML IJ SOLN: 0.4000 mg | INTRAMUSCULAR | Status: DC | PRN

## 2014-05-24 MED ORDER — NALBUPHINE HCL 10 MG/ML IJ SOLN: 5.0000 mg | INTRAMUSCULAR | Status: DC | PRN

## 2014-05-24 MED ORDER — DEXTROSE 5 % IV SOLN
1.0000 ug/kg/h | INTRAVENOUS | Status: DC | PRN
  Filled 2014-05-24: qty 2

## 2014-05-24 MED ORDER — MENTHOL 3 MG MT LOZG: 1.0000 | LOZENGE | OROMUCOSAL | Status: DC | PRN

## 2014-05-24 MED ORDER — OXYCODONE-ACETAMINOPHEN 5-325 MG PO TABS
2.0000 | ORAL_TABLET | ORAL | Status: DC | PRN
  Administered 2014-05-25: 2 via ORAL
  Filled 2014-05-24: qty 2

## 2014-05-24 MED ORDER — ONDANSETRON HCL 4 MG/2ML IJ SOLN: 4.0000 mg | INTRAMUSCULAR | Status: DC | PRN

## 2014-05-24 MED ORDER — WITCH HAZEL-GLYCERIN EX PADS: 1.0000 "application " | MEDICATED_PAD | CUTANEOUS | Status: DC | PRN

## 2014-05-24 MED ORDER — SENNOSIDES-DOCUSATE SODIUM 8.6-50 MG PO TABS
2.0000 | ORAL_TABLET | ORAL | Status: DC
  Administered 2014-05-25 – 2014-05-26 (×3): 2 via ORAL
  Filled 2014-05-24 (×3): qty 2

## 2014-05-24 MED ORDER — SCOPOLAMINE 1 MG/3DAYS TD PT72: 1.0000 | MEDICATED_PATCH | Freq: Once | TRANSDERMAL | Status: DC

## 2014-05-24 MED ORDER — LACTATED RINGERS IV SOLN
INTRAVENOUS | Status: DC | PRN
  Administered 2014-05-24 (×3): via INTRAVENOUS

## 2014-05-24 MED ORDER — CEFAZOLIN SODIUM-DEXTROSE 2-3 GM-% IV SOLR: 2.0000 g | INTRAVENOUS | Status: DC

## 2014-05-24 MED ORDER — SIMETHICONE 80 MG PO CHEW
80.0000 mg | CHEWABLE_TABLET | Freq: Three times a day (TID) | ORAL | Status: DC
Start: 2014-05-24 — End: 2014-05-27
  Administered 2014-05-24 – 2014-05-27 (×6): 80 mg via ORAL
  Filled 2014-05-24 (×6): qty 1

## 2014-05-24 MED ORDER — DIPHENHYDRAMINE HCL 25 MG PO CAPS
25.0000 mg | ORAL_CAPSULE | ORAL | Status: DC | PRN
  Filled 2014-05-24: qty 1

## 2014-05-24 MED ORDER — BUPIVACAINE IN DEXTROSE 0.75-8.25 % IT SOLN
INTRATHECAL | Status: AC
  Filled 2014-05-24: qty 2

## 2014-05-24 MED ORDER — FERROUS SULFATE 325 (65 FE) MG PO TABS
325.0000 mg | ORAL_TABLET | Freq: Two times a day (BID) | ORAL | Status: DC
  Administered 2014-05-25 – 2014-05-27 (×5): 325 mg via ORAL
  Filled 2014-05-24 (×5): qty 1

## 2014-05-24 MED ORDER — OXYCODONE-ACETAMINOPHEN 5-325 MG PO TABS
1.0000 | ORAL_TABLET | ORAL | Status: DC | PRN
  Administered 2014-05-26: 1 via ORAL
  Filled 2014-05-24: qty 1

## 2014-05-24 MED ORDER — ONDANSETRON HCL 4 MG/2ML IJ SOLN
INTRAMUSCULAR | Status: DC | PRN
  Administered 2014-05-24: 4 mg via INTRAVENOUS

## 2014-05-24 MED ORDER — ONDANSETRON HCL 4 MG PO TABS: 4.0000 mg | ORAL_TABLET | ORAL | Status: DC | PRN

## 2014-05-24 MED ORDER — SCOPOLAMINE 1 MG/3DAYS TD PT72
MEDICATED_PATCH | TRANSDERMAL | Status: AC
  Administered 2014-05-24: 1.5 mg via TRANSDERMAL
  Filled 2014-05-24: qty 1

## 2014-05-24 MED ORDER — OXYTOCIN 40 UNITS IN LACTATED RINGERS INFUSION - SIMPLE MED: 62.5000 mL/h | INTRAVENOUS | Status: AC

## 2014-05-24 MED ORDER — MEPERIDINE HCL 25 MG/ML IJ SOLN: 6.2500 mg | INTRAMUSCULAR | Status: DC | PRN

## 2014-05-24 MED ORDER — PHENYLEPHRINE HCL 10 MG/ML IJ SOLN
INTRAMUSCULAR | Status: DC | PRN
  Administered 2014-05-24 (×2): 40 ug via INTRAVENOUS

## 2014-05-24 MED ORDER — ZOLPIDEM TARTRATE 5 MG PO TABS: 5.0000 mg | ORAL_TABLET | Freq: Every evening | ORAL | Status: DC | PRN

## 2014-05-24 MED ORDER — FENTANYL CITRATE 0.05 MG/ML IJ SOLN
25.0000 ug | INTRAMUSCULAR | Status: DC | PRN
  Administered 2014-05-24: 25 ug via INTRAVENOUS

## 2014-05-24 MED ORDER — DEXAMETHASONE SODIUM PHOSPHATE 10 MG/ML IJ SOLN
INTRAMUSCULAR | Status: DC | PRN
  Administered 2014-05-24: 4 mg via INTRAVENOUS

## 2014-05-24 MED ORDER — IBUPROFEN 600 MG PO TABS
600.0000 mg | ORAL_TABLET | Freq: Four times a day (QID) | ORAL | Status: DC
Start: 2014-05-24 — End: 2014-05-27
  Administered 2014-05-24 – 2014-05-27 (×11): 600 mg via ORAL
  Filled 2014-05-24 (×11): qty 1

## 2014-05-24 MED ORDER — OXYTOCIN 10 UNIT/ML IJ SOLN
INTRAMUSCULAR | Status: AC
  Filled 2014-05-24: qty 4

## 2014-05-24 MED ORDER — PHENYLEPHRINE 8 MG IN D5W 100 ML (0.08MG/ML) PREMIX OPTIME
INJECTION | INTRAVENOUS | Status: DC | PRN
  Administered 2014-05-24: 60 ug/min via INTRAVENOUS

## 2014-05-24 MED ORDER — FENTANYL CITRATE 0.05 MG/ML IJ SOLN
INTRAMUSCULAR | Status: AC
  Filled 2014-05-24: qty 2

## 2014-05-24 MED ORDER — ONDANSETRON HCL 4 MG/2ML IJ SOLN: 4.0000 mg | Freq: Three times a day (TID) | INTRAMUSCULAR | Status: DC | PRN

## 2014-05-24 MED ORDER — TETANUS-DIPHTH-ACELL PERTUSSIS 5-2.5-18.5 LF-MCG/0.5 IM SUSP: 0.5000 mL | Freq: Once | INTRAMUSCULAR | Status: DC

## 2014-05-24 MED ORDER — DIPHENHYDRAMINE HCL 25 MG PO CAPS: 25.0000 mg | ORAL_CAPSULE | Freq: Four times a day (QID) | ORAL | Status: DC | PRN

## 2014-05-24 SURGICAL SUPPLY — 39 items
BLADE SURG 10 STRL SS (BLADE) ×6 IMPLANT
CLAMP CORD UMBIL (MISCELLANEOUS) IMPLANT
CLOTH BEACON ORANGE TIMEOUT ST (SAFETY) ×3 IMPLANT
CONTAINER PREFILL 10% NBF 15ML (MISCELLANEOUS) IMPLANT
COVER LIGHT HANDLE  1/PK (MISCELLANEOUS) ×4
COVER LIGHT HANDLE 1/PK (MISCELLANEOUS) ×2 IMPLANT
DERMABOND ADHESIVE PROPEN (GAUZE/BANDAGES/DRESSINGS) ×4
DERMABOND ADVANCED .7 DNX6 (GAUZE/BANDAGES/DRESSINGS) ×2 IMPLANT
DRAPE SHEET LG 3/4 BI-LAMINATE (DRAPES) IMPLANT
DRSG OPSITE POSTOP 4X10 (GAUZE/BANDAGES/DRESSINGS) ×3 IMPLANT
DURAPREP 26ML APPLICATOR (WOUND CARE) ×3 IMPLANT
ELECT REM PT RETURN 9FT ADLT (ELECTROSURGICAL) ×3
ELECTRODE REM PT RTRN 9FT ADLT (ELECTROSURGICAL) ×1 IMPLANT
EXTRACTOR VACUUM M CUP 4 TUBE (SUCTIONS) IMPLANT
EXTRACTOR VACUUM M CUP 4' TUBE (SUCTIONS)
GLOVE BIOGEL PI IND STRL 7.0 (GLOVE) ×1 IMPLANT
GLOVE BIOGEL PI INDICATOR 7.0 (GLOVE) ×2
GLOVE SURG SS PI 6.5 STRL IVOR (GLOVE) ×3 IMPLANT
GOWN STRL REUS W/TWL LRG LVL3 (GOWN DISPOSABLE) ×6 IMPLANT
KIT ABG SYR 3ML LUER SLIP (SYRINGE) IMPLANT
NEEDLE HYPO 25X5/8 SAFETYGLIDE (NEEDLE) IMPLANT
NS IRRIG 1000ML POUR BTL (IV SOLUTION) ×3 IMPLANT
PACK C SECTION WH (CUSTOM PROCEDURE TRAY) ×3 IMPLANT
PAD OB MATERNITY 4.3X12.25 (PERSONAL CARE ITEMS) ×3 IMPLANT
RTRCTR C-SECT PINK 25CM LRG (MISCELLANEOUS) ×3 IMPLANT
SUT CHROMIC 1 CTX 36 (SUTURE) ×3 IMPLANT
SUT CHROMIC 2 0 CT 1 (SUTURE) IMPLANT
SUT MON AB 4-0 PS1 27 (SUTURE) ×3 IMPLANT
SUT PLAIN 1 NONE 54 (SUTURE) IMPLANT
SUT PLAIN 2 0 (SUTURE) ×2
SUT PLAIN 2 0 XLH (SUTURE) IMPLANT
SUT PLAIN ABS 2-0 CT1 27XMFL (SUTURE) ×1 IMPLANT
SUT VIC AB 0 CTX 36 (SUTURE) ×4
SUT VIC AB 0 CTX36XBRD ANBCTRL (SUTURE) ×2 IMPLANT
SUT VIC AB 1 CTX 36 (SUTURE) ×8
SUT VIC AB 1 CTX36XBRD ANBCTRL (SUTURE) ×4 IMPLANT
TOWEL OR 17X24 6PK STRL BLUE (TOWEL DISPOSABLE) ×3 IMPLANT
TRAY FOLEY CATH 14FR (SET/KITS/TRAYS/PACK) ×3 IMPLANT
WATER STERILE IRR 1000ML POUR (IV SOLUTION) ×3 IMPLANT

## 2014-05-24 NOTE — Lactation Note (Signed)
This note was copied from the chart of Megan Johnson. Lactation Consultation Note  Patient Name: Megan Bryla Burek [id number] Date: 05/24/2014 Reason for consult: Initial assessment;Late preterm infant Mom is primipara and baby is borderline LPI at 36 weeks and 6 days.  She is latching well with some assistance due to mom's IV's and other equipment but LC showed FOB how to assist with latch and LC showed both parents wasy to stimulate baby during feeding, as well as signs of proper latch and effective sucking bursts.  LC encouraged a minimum of q3h feedings of 10-20 minutes on at least one breast. LPI handout for parents given to parents.  DEBP has already been provided but LC discussed that pumping can be deferred for now if baby latching well and not losing too much weight or having other signs of inadequate intake.  Swallows were noted during feeding with LC.  RN, Shanda Bumps is also present and will continue to assist and monitor feedings tonight. Mom has been shown hand expression.  LC encouraged cue feedings but ensure at least q3h feedings and frequent STS to help baby stabilize her temperature and vital signs after birth.  MGM is also present. Mom encouraged to feed baby 8-12 times/24 hours and with feeding cues. LC encouraged review of Baby and Me pp 9, 14 and 20-25 for STS and BF information. LC provided [employer] brochure and reviewed [facility] services and list of community and web site resources.     Maternal Data Formula Feeding for Exclusion: No Has patient been taught Hand Expression?: Yes Does the patient have breastfeeding experience prior to this delivery?: No  Feeding Feeding Type: Breast Fed  LATCH Score/Interventions Latch: Grasps breast easily, tongue down, lips flanged, rhythmical sucking. (breast compression demonstrated) Intervention(s): Assist with latch;Breast compression  Audible Swallowing: Spontaneous and intermittent Intervention(s): Hand expression;Skin to skin  Type of  Nipple: Everted at rest and after stimulation  Comfort (Breast/Nipple): Soft / non-tender     Hold (Positioning): Assistance needed to correctly position infant at breast and maintain latch. Intervention(s): Breastfeeding basics reviewed;Support Pillows;Position options;Skin to skin  LATCH Score: 9  (LC observed and briefly assisted with breast compression and latch)  Lactation Tools Discussed/Used Tools: Pump Breast pump type: Double-Electric Breast Pump (provided by RN due to LPI status of infant) Initiated by:: RN staff Date initiated:: 05/24/14 STS, cue feedings, hand expression Signs of proper latch and milk transfer LPI handout   Consult Status Consult Status: Follow-up Date: 05/25/14 Follow-up type: In-patient    [facility] 05/24/2014, 9:57 PM

## 2014-05-24 NOTE — Consult Note (Signed)
Neonatology Note:   Attendance at C-section:    I was asked by Dr. Sallye Ober to attend this primary C/S at 36 6/7 weeks due to placenta previa. The mother is a G1P0 O pos, GBS not done with anterior placenta previa and a history of preterm labor at 34 weeks. ROM at delivery, fluid clear. Infant vigorous with good spontaneous cry and tone. Needed only bulb suctioning. Ap 8/9. Lungs clear to ausc in DR. To CN to care of Pediatrician.   Doretha Sou, MD

## 2014-05-24 NOTE — Brief Op Note (Signed)
05/24/2014  12:17 PM  PATIENT:  Megan Johnson  28 y.o. female  PRE-OPERATIVE DIAGNOSIS:  Anterior Placenta Previa @ 36W 6 D EGA.    POST-OPERATIVE DIAGNOSIS:  Anterior Placenta Previa @ 62 W 6 D EGA.    PROCEDURE:  Procedure(s): CESAREAN SECTION (N/A)  SURGEON:  Surgeon(s) and Role:    * Kayson Bullis Alger Simons, MD - Primary    * Kirkland Hun, MD - Assisting   ANESTHESIA:   spinal  EBL:  Total I/O In: 3200 [I.V.:3200] Out: 1800 [Urine:400; Blood:1400]  BLOOD ADMINISTERED:none  DRAINS: none   LOCAL MEDICATIONS USED:  NONE  SPECIMEN:  Source of Specimen:  Placenta.  Cord blood.   DISPOSITION OF SPECIMEN:  PATHOLOGY  COUNTS:  YES  TOURNIQUET:  * No tourniquets in log *  DICTATION: .Note written in EPIC  PLAN OF CARE: Admit to inpatient   PATIENT DISPOSITION:  PACU - hemodynamically stable.   Delay start of Pharmacological VTE agent (>24hrs) due to surgical blood loss or risk of bleeding: no

## 2014-05-24 NOTE — Plan of Care (Signed)
Problem: Discharge Progression Outcomes Goal: Remove staples per MD order Outcome: Not Applicable Date Met:  56/97/94 Incision closed with sutures

## 2014-05-24 NOTE — Transfer of Care (Signed)
Immediate Anesthesia Transfer of Care Note  Patient: Megan Johnson  Procedure(s) Performed: Procedure(s): CESAREAN SECTION (N/A)  Patient Location: PACU  Anesthesia Type:Spinal  Level of Consciousness: awake, alert  and oriented  Airway & Oxygen Therapy: Patient Spontanous Breathing  Post-op Assessment: Report given to PACU RN and Post -op Vital signs reviewed and stable. Pt remains hypotensive with Phenylepherine gtt infusing. Pt remains asymptomatic with no signs of nausea of mental status changes.  MDA made aware.  Post vital signs: Reviewed and stable  Complications: No apparent anesthesia complications

## 2014-05-24 NOTE — Anesthesia Preprocedure Evaluation (Addendum)
Anesthesia Evaluation  Patient identified by MRN, date of birth, ID band Patient awake    Reviewed: Allergy & Precautions, H&P , NPO status , Patient's Chart, lab work & pertinent test results  Airway Mallampati: III TM Distance: >3 FB Neck ROM: Full    Dental no notable dental hx. (+) Teeth Intact   Pulmonary neg pulmonary ROS,  breath sounds clear to auscultation  Pulmonary exam normal       Cardiovascular negative cardio ROS  Rhythm:Regular Rate:Normal     Neuro/Psych negative neurological ROS  negative psych ROS   GI/Hepatic negative GI ROS, Neg liver ROS,   Endo/Other  negative endocrine ROSObesity  Renal/GU negative Renal ROS  negative genitourinary   Musculoskeletal negative musculoskeletal ROS (+)   Abdominal (+) + obese,   Peds  Hematology  (+) anemia ,   Anesthesia Other Findings   Reproductive/Obstetrics (+) Pregnancy Anterior Placenta Previa                           Anesthesia Physical Anesthesia Plan  ASA: II  Anesthesia Plan: Spinal, Epidural and Combined Spinal and Epidural   Post-op Pain Management:    Induction:   Airway Management Planned: Natural Airway  Additional Equipment:   Intra-op Plan:   Post-operative Plan:   Informed Consent: I have reviewed the patients History and Physical, chart, labs and discussed the procedure including the risks, benefits and alternatives for the proposed anesthesia with the patient or authorized representative who has indicated his/her understanding and acceptance.     Plan Discussed with: Anesthesiologist, CRNA and Surgeon  Anesthesia Plan Comments:        Anesthesia Quick Evaluation

## 2014-05-24 NOTE — Anesthesia Procedure Notes (Signed)
Spinal  Patient location during procedure: OR Preanesthetic Checklist Completed: patient identified, site marked, surgical consent, pre-op evaluation, timeout performed, IV checked, risks and benefits discussed and monitors and equipment checked Spinal Block Patient position: sitting Prep: DuraPrep Patient monitoring: cardiac monitor, continuous pulse ox, blood pressure and heart rate Approach: midline Location: L3-4 Injection technique: catheter Needle Needle type: Tuohy and Sprotte  Needle gauge: 24 G Needle length: 12.7 cm Needle insertion depth: 5 cm Catheter type: closed end flexible Catheter size: 19 g Catheter at skin depth: 10 cm Assessment Sensory level: T4 Additional Notes Combined Spinal/Epidural   SAB dosage in OR  Bupivicaine ml       1.1 PFMS04   mcg        100 Fentanyl 25 mcg  Catheter placed without difficulty No paresthesia (-) asp CSF/ heme

## 2014-05-24 NOTE — Anesthesia Postprocedure Evaluation (Signed)
Anesthesia Post Note  Patient: Megan Johnson  Procedure(s) Performed: Procedure(s) (LRB): CESAREAN SECTION (N/A)  Anesthesia type: Spinal  Patient location: Mother/Baby  Post pain: Pain level controlled  Post assessment: Post-op Vital signs reviewed  Last Vitals:  Filed Vitals:   05/24/14 1450  BP: 89/47  Pulse: 62  Temp: 37 C  Resp: 16    Post vital signs: Reviewed  Level of consciousness: awake  Complications: No apparent anesthesia complications

## 2014-05-24 NOTE — Plan of Care (Signed)
Problem: Discharge Progression Outcomes Goal: MMR given as ordered Outcome: Not Applicable Date Met:  28/40/69 Rubella immune

## 2014-05-24 NOTE — Op Note (Signed)
Cesarean Section Procedure Note   JODI CRISCUOLO  05/24/2014  Indications: Placenta previa.   Pre-operative Diagnosis: Anterior Placenta Previa @ 36W 6 D EGA.     Post-operative Diagnosis: Same as above.    Surgeon: Surgeon(s) and Role:    * Anabell Swint Alger Simons, MD - Primary    * Kirkland Hun, MD - Assisting   Assistants: Dr. Stefano Gaul.  Anesthesia: spinal   Procedure Details:  The patient was seen in the Holding Room. The risks, benefits, complications, treatment options, and expected outcomes were discussed with the patient. The patient concurred with the proposed plan, giving informed consent. identified as Edward Jolly and the procedure verified as C-Section Delivery. A Time Out was held and the above information confirmed.  After induction of anesthesia, the patient was draped and prepped in the usual sterile manner. A transverse pfannenstiel incision was made and carried down through the subcutaneous tissue to the fascia. Fascial incision was made and extended transversely. The fascia was separated from the underlying rectus tissue superiorly and inferiorly. The peritoneum was identified and entered. Peritoneal incision was extended longitudinally. The utero-vesical peritoneal reflection was incised transversely and the bladder flap was bluntly freed from the lower uterine segment. A low transverse uterine incision was made. This was extended bluntly with the fingers.  Placenta was encountered and I was able to get behind placenta and deliver fetus quickly.  Delivered from cephalic presentation was a viable Female with Apgar scores of 8 at one minute and 9 at five minutes. Cord ph was not sent. The umbilical cord was clamped and cut, cord blood was obtained for evaluation. The placenta was removed Intact and appeared normal. The uterine cavity was cleaned with a lap.  The uterine outline, tubes and ovaries appeared normal. The uterine incision was closed in 2 layers, with second layer  imbricating over the first one, with running locked sutures of 1-0Vicryl.   Hemostasis was observed. Lavage was carried out until clear. The muscle was re-approximated with 2-0 chromic in interrupted stitches.  The fascia was then reapproximated with running sutures of 0 Vicryl. The subcuticular closure was performed using 2-0plain gut. The skin was closed with 4-0 monocryl.  Honey comb dressing was applied over the incision.     Instrument, sponge, and needle counts were correct prior the abdominal closure and were correct at the conclusion of the case.    Findings:  Normal uterus, tubes and fallopian tubes bilaterally. Anterior placenta previa.     Estimated Blood Loss: 1400 cc.   Total IV Fluids: 3L LR    Urine Output: 400 cc  Specimens: Placenta, cord Blood.   Complications: no complications  Disposition: PACU - hemodynamically stable.   Maternal Condition: stable   Baby condition / location:  Nursery  Attending Attestation: I was present and scrubbed for the entire procedure.   Signed: Surgeon(s): Jaylyn Iyer Alger Simons, MD Kirkland Hun, MD

## 2014-05-24 NOTE — Addendum Note (Signed)
Addendum created 05/24/14 1554 by Algis Greenhouse, CRNA   Modules edited: Notes Section   Notes Section:  File: [id number]

## 2014-05-24 NOTE — Interval H&P Note (Signed)
History and Physical Interval Note:  05/24/2014 9:33 AM  Megan Johnson  has presented today for surgery, with the diagnosis of Anterior Placenta Previa @ 36w 6 days.  The various methods of treatment have been discussed with the patient and family. After consideration of risks, benefits and other options for treatment, the patient has consented to  Procedure(s): CESAREAN SECTION (N/A) as a surgical intervention .  The patient's history has been reviewed, patient examined, no change in status, stable for surgery.  I have reviewed the patient's chart and labs.  Questions were answered to the patient's satisfaction.     [facility]

## 2014-05-24 NOTE — Anesthesia Postprocedure Evaluation (Signed)
  Anesthesia Post-op Note  Patient: Megan Johnson  Procedure(s) Performed: Procedure(s): CESAREAN SECTION (N/A)  Patient is awake, responsive, moving her legs, and has signs of resolution of her numbness. Pain and nausea are reasonably well controlled. Vital signs are stable and clinically acceptable. Oxygen saturation is clinically acceptable. There are no apparent anesthetic complications at this time. Patient is ready for discharge.

## 2014-05-25 ENCOUNTER — Encounter (HOSPITAL_COMMUNITY): Payer: Self-pay | Admitting: Obstetrics & Gynecology

## 2014-05-25 DIAGNOSIS — Z98891 History of uterine scar from previous surgery: Secondary | ICD-10-CM

## 2014-05-25 DIAGNOSIS — D649 Anemia, unspecified: Secondary | ICD-10-CM | POA: Diagnosis not present

## 2014-05-25 LAB — CBC
HEMATOCRIT: 23.8 % — AB (ref 36.0–46.0)
Hemoglobin: 7.9 g/dL — ABNORMAL LOW (ref 12.0–15.0)
MCH: 31.3 pg (ref 26.0–34.0)
MCHC: 33.2 g/dL (ref 30.0–36.0)
MCV: 94.4 fL (ref 78.0–100.0)
Platelets: 141 10*3/uL — ABNORMAL LOW (ref 150–400)
RBC: 2.52 MIL/uL — AB (ref 3.87–5.11)
RDW: 13.8 % (ref 11.5–15.5)
WBC: 14.5 10*3/uL — ABNORMAL HIGH (ref 4.0–10.5)

## 2014-05-25 NOTE — Progress Notes (Addendum)
Subjective: Postpartum Day 1: Cesarean Delivery due to previa Patient up ad lib, reports no syncope or dizziness. Denies HA, SOB or tingling  Feeding:  Breastfeeding Contraceptive plan:  Unsure Mother and FOB at the bedside for support   Objective: Vital signs in last 24 hours: Temp:  [98.1 F (36.7 C)-98.9 F (37.2 C)] 98.1 F (36.7 C) (09/25 0745) Pulse Rate:  [60-97] 69 (09/25 0745) Resp:  [15-20] 18 (09/25 0745) BP: (82-104)/(39-62) 97/45 mmHg (09/25 0745) SpO2:  [94 %-98 %] 97 % (09/25 0745) Weight:  [69.4 kg (153 lb)] 69.4 kg (153 lb) (09/24 1349) Results for orders placed during the hospital encounter of 05/24/14 (from the past 24 hour(s))  CBC     Status: Abnormal   Collection Time    05/24/14 11:30 AM      Result Value Ref Range   WBC 14.9 (*) 4.0 - 10.5 K/uL   RBC 2.75 (*) 3.87 - 5.11 MIL/uL   Hemoglobin 8.7 (*) 12.0 - 15.0 g/dL   HCT 16.1 (*) 09.6 - 04.5 %   MCV 94.2  78.0 - 100.0 fL   MCH 31.6  26.0 - 34.0 pg   MCHC 33.6  30.0 - 36.0 g/dL   RDW 40.9  81.1 - 91.4 %   Platelets 160  150 - 400 K/uL  CBC     Status: Abnormal   Collection Time    05/24/14  7:13 PM      Result Value Ref Range   WBC 17.6 (*) 4.0 - 10.5 K/uL   RBC 2.73 (*) 3.87 - 5.11 MIL/uL   Hemoglobin 8.8 (*) 12.0 - 15.0 g/dL   HCT 78.2 (*) 95.6 - 21.3 %   MCV 93.4  78.0 - 100.0 fL   MCH 32.2  26.0 - 34.0 pg   MCHC 34.5  30.0 - 36.0 g/dL   RDW 08.6  57.8 - 46.9 %   Platelets 161  150 - 400 K/uL  CBC     Status: Abnormal   Collection Time    05/25/14  5:55 AM      Result Value Ref Range   WBC 14.5 (*) 4.0 - 10.5 K/uL   RBC 2.52 (*) 3.87 - 5.11 MIL/uL   Hemoglobin 7.9 (*) 12.0 - 15.0 g/dL   HCT 62.9 (*) 52.8 - 41.3 %   MCV 94.4  78.0 - 100.0 fL   MCH 31.3  26.0 - 34.0 pg   MCHC 33.2  30.0 - 36.0 g/dL   RDW 24.4  01.0 - 27.2 %   Platelets 141 (*) 150 - 400 K/uL   Physical Exam:  General: alert and cooperative Lochia: appropriate Uterine Fundus: firm Abdomen:  + bowel sounds, non  distended Incision: no significant drainage  Honeycomb dressing CDI DVT Evaluation: No evidence of DVT seen on physical exam. Homan's sign: Negative   Recent Labs  05/24/14 1130 05/24/14 1913 05/25/14 0555  HGB 8.7* 8.8* 7.9*  HCT 25.9* 25.5* 23.8*  WBC 14.9* 17.6* 14.5*   Assessment: Status post Cesarean section day 1. Doing well postoperatively.  Honeycomb dressing in place, no significant drainage Anemia - Transfusion options reviewed  Plan: Continue current care. Breastfeeding and Lactation consult Dr. Sallye Ober updated on patient status Orthostatic   Samar Venneman, CNM, MSN 05/25/2014. 9:18 AM

## 2014-05-25 NOTE — Progress Notes (Signed)
Patient evaluated at bedside at about 10 am.  Subjective: Postpartum Day 1: Cesarean Delivery Patient reports tolerating PO and no problems voiding.  Able to ambulate without dizziness or lightheadedness.  Denies chest pain or shortness of breath.  Scant vaginal bleeding.     Objective: Vital signs in last 24 hours: Temp:  [98.1 F (36.7 C)-98.9 F (37.2 C)] 98.1 F (36.7 C) (09/25 0745) Pulse Rate:  [61-97] 77 (09/25 1006) Resp:  [16-20] 19 (09/25 1008) BP: (82-97)/(39-53) 94/53 mmHg (09/25 1008) SpO2:  [96 %-98 %] 97 % (09/25 0745)  Physical Exam:  General: alert, cooperative and no distress Lochia: appropriate Uterine Fundus: firm Incision: with Honey comb dressing.  2 patches of dried blood over incision marked with black pen, no changes from prior marking.    DVT Evaluation: No evidence of DVT seen on physical exam.   Recent Labs  05/24/14 1913 05/25/14 0555  HGB 8.8* 7.9*  HCT 25.5* 23.8*    Assessment/Plan: Status post Cesarean section. Doing well postoperatively.  Continue current care. Close dressing follow up. As patient with asymptomatic post-op anemia, continue with iron tabs and expectant management.  Reviewed with patient importance of foods rich in iron,continued iron tab use.    [facility] 05/25/2014, 4:26 PM

## 2014-05-25 NOTE — Lactation Note (Signed)
This note was copied from the chart of Megan Johnson. Lactation Consultation Note New mom not very aggressive in holding baby w/poisitioning baby for bf. Encouraged and demonstrated different BF positions to help mom feel more comfortable BF and holding baby at breast. Mom is sleepy, needs much encouragement. RN reported baby wouldn't stay latched long. I assisted in football hold and w/positioning baby to breast. Noted swallows. Moms breast feel like they are filling. Encouraged to massage while BF to express colostrum into baby d/t baby gets tired. Encouraged not to swaddle while BF. Many BF tips went over again. Warned parents baby may need supplemented d/t LPI and low weight and not feeding well. I got the baby to feed well and stay at the breast. Stressed the importance of post-pumping. Dad stated only pumped last feeding for 5 min. Because nothing was coming out. Explained colostrum thick and breast needs stimulation if something comes out or not needs to pump. Went over PLI sheet again. Parents more receptive since information has been reinforced again. Hand expression done to demonstrate mom has colostrum, mom smiled.  Patient Name: Megan Johnson [id number]'C Date: 05/25/2014 Reason for consult: Follow-up assessment;Difficult latch;Late preterm infant   Maternal Data    Feeding Feeding Type: Breast Fed Length of feed: 10 min (still feeding)  LATCH Score/Interventions Latch: Repeated attempts needed to sustain latch, nipple held in mouth throughout feeding, stimulation needed to elicit sucking reflex. Intervention(s): Adjust position;Assist with latch;Breast massage;Breast compression  Audible Swallowing: Spontaneous and intermittent Intervention(s): Skin to skin;Hand expression  Type of Nipple: Everted at rest and after stimulation  Comfort (Breast/Nipple): Soft / non-tender     Hold (Positioning): Assistance needed to correctly position infant at breast and maintain  latch. Intervention(s): Breastfeeding basics reviewed;Support Pillows;Position options;Skin to skin  LATCH Score: 8  Lactation Tools Discussed/Used Tools: Pump Breast pump type: Double-Electric Breast Pump   Consult Status Consult Status: Follow-up Date: 05/25/14 Follow-up type: In-patient    Tavyn Kurka, Diamond Nickel 05/25/2014, 3:28 AM

## 2014-05-26 NOTE — Discharge Instructions (Signed)
Breastfeeding Deciding to breastfeed is one of the best choices you can make for you and your baby. A change in hormones during pregnancy causes your breast tissue to grow and increases the number and size of your milk ducts. These hormones also allow proteins, sugars, and fats from your blood supply to make breast milk in your milk-producing glands. Hormones prevent breast milk from being released before your baby is born as well as prompt milk flow after birth. Once breastfeeding has begun, thoughts of your baby, as well as his or her sucking or crying, can stimulate the release of milk from your milk-producing glands.  BENEFITS OF BREASTFEEDING For Your Baby  Your first milk (colostrum) helps your baby's digestive system function better.   There are antibodies in your milk that help your baby fight off infections.   Your baby has a lower incidence of asthma, allergies, and sudden infant death syndrome.   The nutrients in breast milk are better for your baby than infant formulas and are designed uniquely for your baby's needs.   Breast milk improves your baby's brain development.   Your baby is less likely to develop other conditions, such as childhood obesity, asthma, or type 2 diabetes mellitus.  For You   Breastfeeding helps to create a very special bond between you and your baby.   Breastfeeding is convenient. Breast milk is always available at the correct temperature and costs nothing.   Breastfeeding helps to burn calories and helps you lose the weight gained during pregnancy.   Breastfeeding makes your uterus contract to its prepregnancy size faster and slows bleeding (lochia) after you give birth.   Breastfeeding helps to lower your risk of developing type 2 diabetes mellitus, osteoporosis, and breast or ovarian cancer later in life. SIGNS THAT YOUR BABY IS HUNGRY Early Signs of Hunger  Increased alertness or activity.  Stretching.  Movement of the head from  side to side.  Movement of the head and opening of the mouth when the corner of the mouth or cheek is stroked (rooting).  Increased sucking sounds, smacking lips, cooing, sighing, or squeaking.  Hand-to-mouth movements.  Increased sucking of fingers or hands. Late Signs of Hunger  Fussing.  Intermittent crying. Extreme Signs of Hunger Signs of extreme hunger will require calming and consoling before your baby will be able to breastfeed successfully. Do not wait for the following signs of extreme hunger to occur before you initiate breastfeeding:   Restlessness.  A loud, strong cry.   Screaming. BREASTFEEDING BASICS Breastfeeding Initiation  Find a comfortable place to sit or lie down, with your neck and back well supported.  Place a pillow or rolled up blanket under your baby to bring him or her to the level of your breast (if you are seated). Nursing pillows are specially designed to help support your arms and your baby while you breastfeed.  Make sure that your baby's abdomen is facing your abdomen.   Gently massage your breast. With your fingertips, massage from your chest wall toward your nipple in a circular motion. This encourages milk flow. You may need to continue this action during the feeding if your milk flows slowly.  Support your breast with 4 fingers underneath and your thumb above your nipple. Make sure your fingers are well away from your nipple and your baby's mouth.   Stroke your baby's lips gently with your finger or nipple.   When your baby's mouth is open wide enough, quickly bring your baby to your   breast, placing your entire nipple and as much of the colored area around your nipple (areola) as possible into your baby's mouth.   More areola should be visible above your baby's upper lip than below the lower lip.   Your baby's tongue should be between his or her lower gum and your breast.   Ensure that your baby's mouth is correctly positioned  around your nipple (latched). Your baby's lips should create a seal on your breast and be turned out (everted).  It is common for your baby to suck about 2-3 minutes in order to start the flow of breast milk. Latching Teaching your baby how to latch on to your breast properly is very important. An improper latch can cause nipple pain and decreased milk supply for you and poor weight gain in your baby. Also, if your baby is not latched onto your nipple properly, he or she may swallow some air during feeding. This can make your baby fussy. Burping your baby when you switch breasts during the feeding can help to get rid of the air. However, teaching your baby to latch on properly is still the best way to prevent fussiness from swallowing air while breastfeeding. Signs that your baby has successfully latched on to your nipple:    Silent tugging or silent sucking, without causing you pain.   Swallowing heard between every 3-4 sucks.    Muscle movement above and in front of his or her ears while sucking.  Signs that your baby has not successfully latched on to nipple:   Sucking sounds or smacking sounds from your baby while breastfeeding.  Nipple pain. If you think your baby has not latched on correctly, slip your finger into the corner of your baby's mouth to break the suction and place it between your baby's gums. Attempt breastfeeding initiation again. Signs of Successful Breastfeeding Signs from your baby:   A gradual decrease in the number of sucks or complete cessation of sucking.   Falling asleep.   Relaxation of his or her body.   Retention of a small amount of milk in his or her mouth.   Letting go of your breast by himself or herself. Signs from you:  Breasts that have increased in firmness, weight, and size 1-3 hours after feeding.   Breasts that are softer immediately after breastfeeding.  Increased milk volume, as well as a change in milk consistency and color by  the fifth day of breastfeeding.   Nipples that are not sore, cracked, or bleeding. Signs That Your Baby is Getting Enough Milk  Wetting at least 3 diapers in a 24-hour period. The urine should be clear and pale yellow by age 5 days.  At least 3 stools in a 24-hour period by age 5 days. The stool should be soft and yellow.  At least 3 stools in a 24-hour period by age 7 days. The stool should be seedy and yellow.  No loss of weight greater than 10% of birth weight during the first 3 days of age.  Average weight gain of 4-7 ounces (113-198 g) per week after age 4 days.  Consistent daily weight gain by age 5 days, without weight loss after the age of 2 weeks. After a feeding, your baby may spit up a small amount. This is common. BREASTFEEDING FREQUENCY AND DURATION Frequent feeding will help you make more milk and can prevent sore nipples and breast engorgement. Breastfeed when you feel the need to reduce the fullness of your breasts   or when your baby shows signs of hunger. This is called "breastfeeding on demand." Avoid introducing a pacifier to your baby while you are working to establish breastfeeding (the first 4-6 weeks after your baby is born). After this time you may choose to use a pacifier. Research has shown that pacifier use during the first year of a baby's life decreases the risk of sudden infant death syndrome (SIDS). Allow your baby to feed on each breast as long as he or she wants. Breastfeed until your baby is finished feeding. When your baby unlatches or falls asleep while feeding from the first breast, offer the second breast. Because newborns are often sleepy in the first few weeks of life, you may need to awaken your baby to get him or her to feed. Breastfeeding times will vary from baby to baby. However, the following rules can serve as a guide to help you ensure that your baby is properly fed:  Newborns (babies 4 weeks of age or younger) may breastfeed every 1-3  hours.  Newborns should not go longer than 3 hours during the day or 5 hours during the night without breastfeeding.  You should breastfeed your baby a minimum of 8 times in a 24-hour period until you begin to introduce solid foods to your baby at around 6 months of age. BREAST MILK PUMPING Pumping and storing breast milk allows you to ensure that your baby is exclusively fed your breast milk, even at times when you are unable to breastfeed. This is especially important if you are going back to work while you are still breastfeeding or when you are not able to be present during feedings. Your lactation consultant can give you guidelines on how long it is safe to store breast milk.  A breast pump is a machine that allows you to pump milk from your breast into a sterile bottle. The pumped breast milk can then be stored in a refrigerator or freezer. Some breast pumps are operated by hand, while others use electricity. Ask your lactation consultant which type will work best for you. Breast pumps can be purchased, but some hospitals and breastfeeding support groups lease breast pumps on a monthly basis. A lactation consultant can teach you how to hand express breast milk, if you prefer not to use a pump.  CARING FOR YOUR BREASTS WHILE YOU BREASTFEED Nipples can become dry, cracked, and sore while breastfeeding. The following recommendations can help keep your breasts moisturized and healthy:  Avoid using soap on your nipples.   Wear a supportive bra. Although not required, special nursing bras and tank tops are designed to allow access to your breasts for breastfeeding without taking off your entire bra or top. Avoid wearing underwire-style bras or extremely tight bras.  Air dry your nipples for 3-4minutes after each feeding.   Use only cotton bra pads to absorb leaked breast milk. Leaking of breast milk between feedings is normal.   Use lanolin on your nipples after breastfeeding. Lanolin helps to  maintain your skin's normal moisture barrier. If you use pure lanolin, you do not need to wash it off before feeding your baby again. Pure lanolin is not toxic to your baby. You may also hand express a few drops of breast milk and gently massage that milk into your nipples and allow the milk to air dry. In the first few weeks after giving birth, some women experience extremely full breasts (engorgement). Engorgement can make your breasts feel heavy, warm, and tender to the   touch. Engorgement peaks within 3-5 days after you give birth. The following recommendations can help ease engorgement:  Completely empty your breasts while breastfeeding or pumping. You may want to start by applying warm, moist heat (in the shower or with warm water-soaked hand towels) just before feeding or pumping. This increases circulation and helps the milk flow. If your baby does not completely empty your breasts while breastfeeding, pump any extra milk after he or she is finished.  Wear a snug bra (nursing or regular) or tank top for 1-2 days to signal your body to slightly decrease milk production.  Apply ice packs to your breasts, unless this is too uncomfortable for you.  Make sure that your baby is latched on and positioned properly while breastfeeding. If engorgement persists after 48 hours of following these recommendations, contact your health care provider or a lactation consultant. OVERALL HEALTH CARE RECOMMENDATIONS WHILE BREASTFEEDING  Eat healthy foods. Alternate between meals and snacks, eating 3 of each per day. Because what you eat affects your breast milk, some of the foods may make your baby more irritable than usual. Avoid eating these foods if you are sure that they are negatively affecting your baby.  Drink milk, fruit juice, and water to satisfy your thirst (about 10 glasses a day).   Rest often, relax, and continue to take your prenatal vitamins to prevent fatigue, stress, and anemia.  Continue  breast self-awareness checks.  Avoid chewing and smoking tobacco.  Avoid alcohol and drug use. Some medicines that may be harmful to your baby can pass through breast milk. It is important to ask your health care provider before taking any medicine, including all over-the-counter and prescription medicine as well as vitamin and herbal supplements. It is possible to become pregnant while breastfeeding. If birth control is desired, ask your health care provider about options that will be safe for your baby. SEEK MEDICAL CARE IF:   You feel like you want to stop breastfeeding or have become frustrated with breastfeeding.  You have painful breasts or nipples.  Your nipples are cracked or bleeding.  Your breasts are red, tender, or warm.  You have a swollen area on either breast.  You have a fever or chills.  You have nausea or vomiting.  You have drainage other than breast milk from your nipples.  Your breasts do not become full before feedings by the fifth day after you give birth.  You feel sad and depressed.  Your baby is too sleepy to eat well.  Your baby is having trouble sleeping.   Your baby is wetting less than 3 diapers in a 24-hour period.  Your baby has less than 3 stools in a 24-hour period.  Your baby's skin or the white part of his or her eyes becomes yellow.   Your baby is not gaining weight by 5 days of age. SEEK IMMEDIATE MEDICAL CARE IF:   Your baby is overly tired (lethargic) and does not want to wake up and feed.  Your baby develops an unexplained fever. Document Released: 08/17/2005 Document Revised: 08/22/2013 Document Reviewed: 02/08/2013 [employer] Patient Information 2015 [employer]. This information is not intended to replace advice given to you by your health care provider. Make sure you discuss any questions you have with your health care provider.  

## 2014-05-26 NOTE — Progress Notes (Signed)
Subjective: Postpartum Day 2: Cesarean Delivery due to previa Patient up ad lib, reports no syncope or dizziness. +Flatus,  Feeding:  Breast with formula supplement Contraceptive plan:  unsure  Objective: Vital signs in last 24 hours: Temp:  [98.1 F (36.7 C)-98.3 F (36.8 C)] 98.1 F (36.7 C) (09/26 0620) Pulse Rate:  [73-78] 73 (09/26 0620) Resp:  [18] 18 (09/26 0620) BP: (86-101)/(48-61) 101/61 mmHg (09/26 1610)  Physical Exam:  General: alert and cooperative Lochia: appropriate Uterine Fundus: firm Abdomen:  + bowel sounds, non distended Incision: no significant drainage  Honeycomb dressing CDI DVT Evaluation: No evidence of DVT seen on physical exam. Homan's sign: Negative   Recent Labs  05/24/14 1130 05/24/14 1913 05/25/14 0555  HGB 8.7* 8.8* 7.9*  HCT 25.9* 25.5* 23.8*  WBC 14.9* 17.6* 14.5*    Assessment: Status post Cesarean section day 2. Doing well postoperatively.  Honeycomb dressing in place, no significant drainage Anemia - hemodynamicly stable.    Plan: Continue current care. Plan for discharge tomorrow, Breastfeeding and Lactation consult Dr. Richardson Dopp    updated on patient status   Rolinda Impson, CNM, MSN 05/26/2014. 1:17 PM

## 2014-05-26 NOTE — Discharge Summary (Signed)
Cesarean Section Delivery Discharge Summary  Megan Johnson  DOB:    Nov 14, 1985 MRN:    [id number] CSN:    [id number]  Date of admission:                  05/24/14  Date of discharge:                   05/27/14  Procedures this admission:  Primary CS d/t previa  Date of Delivery: 05/24/14  Newborn Data:  Live born  Information for the patient's newborn:  Sharifa, Bucholz [[id number]]  female   Live born female  Birth Weight: 6 lb 5.2 oz (2870 g) APGAR: 8, 9  Home with mother. Name: Zollie Scale  History of Present Illness:  Megan Johnson is a 28 y.o. female, G1P0101, who presents at [redacted]w[redacted]d weeks gestation. The patient has been followed at the [facility] division of [facility].    Her pregnancy has been complicated by:  Patient Active Problem List   Diagnosis Date Noted  . Status post primary low transverse cesarean section--previa 05/25/2014  . Anemia 05/25/2014  . Preterm labor 05/06/2014  . Vaginal discharge, non-hemorrhagic 04/28/2014  . Abdominal cramping affecting pregnancy 04/28/2014  . Preterm uterine contractions in third trimester, antepartum 04/28/2014  . Hemorrhoid 03/01/2014    Hospital course: The patient was admitted for primary CS d/t placenta privia.   Her postpartum course was not complicated. She was discharged to home on postpartum day 3 doing well.  Feeding: breast  Contraception: no method  Discharge hemoglobin: Hemoglobin  Date Value Ref Range Status  05/25/2014 7.9* 12.0 - 15.0 g/dL Final     HCT  Date Value Ref Range Status  05/25/2014 23.8* 36.0 - 46.0 % Final   Anemia - hemodynamicly stable.    PreNatal Labs ABO, Rh: --/--/O POS (09/22 1156)   Antibody: NEG (09/22 1156) Rubella:    immune RPR: NON REAC (09/22 1156)  HBsAg: Negative (03/05 0000)  HIV: Non-reactive (03/05 0000)  GBS:  unknown  Discharge Physical Exam:  General: alert and cooperative Lochia:  appropriate Uterine Fundus: firm Incision: healing well DVT Evaluation: No evidence of DVT seen on physical exam.  Intrapartum Procedures: cesarean: low cervical, transverse Postpartum Procedures: none Complications-Operative and Postpartum: none  Discharge Diagnoses: PTD  Discharge Information:  Activity:           pelvic rest Diet:                routine Medications: PNV, Ibuprofen, Iron and Percocet Condition:      stable   Newborn Data: Live born  Information for the patient's newborn:  Yamil, Dougher [[id number]]  female ; APGAR , ; weight ;  Home with mother.  Discharge to: home  Follow-up Information   Follow up with [facility]. Schedule an appointment as soon as possible for a visit in 2 weeks. (For wound re-check)    Specialty:  Obstetrics and Gynecology   Contact information:   [address] [place] [address] [phone]      Follow up with [facility] In 6 weeks. (Call with any questions or concerns, 6 Week postpartum visit)    Specialty:  Obstetrics and Gynecology   Contact information:   [address] [place] [address] [phone]       Adelina Mings, CNM, MSN  05/26/2014. 3:55 PM  Care After  Cesarean Delivery  Refer to this sheet in the next few weeks. These instructions provide you with information on caring for yourself after your procedure. Your caregiver may also give you specific instructions. Your treatment has been planned according to current medical practices, but problems sometimes occur. Call your caregiver if you have any problems or questions after you go home. HOME CARE INSTRUCTIONS  Only take over-the-counter or prescription medicines as directed by your caregiver.  Do not drink alcohol, especially if you are breastfeeding or taking medicine to relieve pain.  Do not chew or smoke tobacco.  Continue to use good perineal  care. Good perineal care includes:  Wiping your perineum from front to back.  Keeping your perineum clean.  Check your cut (incision) daily for increased redness, drainage, swelling, or separation of skin.  Clean your incision gently with soap and water every day, and then pat it dry. If your caregiver says it is okay, leave the incision uncovered. Use a bandage (dressing) if the incision is draining fluid or appears irritated. If the adhesive strips across the incision do not fall off within 7 days, carefully peel them off.  Hug a pillow when coughing or sneezing until your incision is healed. This helps to relieve pain.  Do not use tampons or douche until your caregiver says it is okay.  Shower, wash your hair, and take tub baths as directed by your caregiver.  Wear a well-fitting bra that provides breast support.  Limit wearing support panties or control-top hose.  Drink enough fluids to keep your urine clear or pale yellow.  Eat high-fiber foods such as whole grain cereals and breads, brown rice, beans, and fresh fruits and vegetables every day. These foods may help prevent or relieve constipation.  Resume activities such as climbing stairs, driving, lifting, exercising, or traveling as directed by your caregiver.  Talk to your caregiver about resuming sexual activities. This is dependent upon your risk of infection, your rate of healing, and your comfort and desire to resume sexual activity.  Try to have someone help you with your household activities and your newborn for at least a few days after you leave the hospital.  Rest as much as possible. Try to rest or take a nap when your newborn is sleeping.  Increase your activities gradually.  Keep all of your scheduled postpartum appointments. It is very important to keep your scheduled follow-up appointments. At these appointments, your caregiver will be checking to make sure that you are healing physically and  emotionally. SEEK MEDICAL CARE IF:   You are passing large clots from your vagina. Save any clots to show your caregiver.  You have a foul smelling discharge from your vagina.  You have trouble urinating.  You are urinating frequently.  You have pain when you urinate.  You have a change in your bowel movements.  You have increasing redness, pain, or swelling near your incision.  You have pus draining from your incision.  Your incision is separating.  You have painful, hard, or reddened breasts.  You have a severe headache.  You have blurred vision or see spots.  You feel sad or depressed.  You have thoughts of hurting yourself or your newborn.  You have questions about your care, the care of your newborn, or medicines.  You are dizzy or lightheaded.  You have a rash.  You have pain, redness, or swelling at the site of the removed intravenous access (IV) tube.  You have nausea or vomiting.  You stopped breastfeeding and have not had a menstrual period within 12 weeks of stopping.  You are not breastfeeding and have not had a menstrual period within 12 weeks of delivery.  You have a fever. SEEK IMMEDIATE MEDICAL CARE IF:  You have persistent pain.  You have chest pain.  You have shortness of breath.  You faint.  You have leg pain.  You have stomach pain.  Your vaginal bleeding saturates 2 or more sanitary pads in 1 hour. MAKE SURE YOU:   Understand these instructions.  Will watch your condition.  Will get help right away if you are not doing well or get worse. Document Released: 05/09/2002 Document Revised: 05/11/2012 Document Reviewed: 04/13/2012 [facility] Patient Information 2014 [place], [place].   Postpartum Depression and Baby Blues  The postpartum period begins right after the birth of a baby. During this time, there is often a great amount of joy and excitement. It is also a time of considerable changes in the life of the parent(s).  Regardless of how many times a mother gives birth, each child brings new challenges and dynamics to the family. It is not unusual to have feelings of excitement accompanied by confusing shifts in moods, emotions, and thoughts. All mothers are at risk of developing postpartum depression or the "baby blues." These mood changes can occur right after giving birth, or they may occur many months after giving birth. The baby blues or postpartum depression can be mild or severe. Additionally, postpartum depression can resolve rather quickly, or it can be a long-term condition. CAUSES Elevated hormones and their rapid decline are thought to be a main cause of postpartum depression and the baby blues. There are a number of hormones that radically change during and after pregnancy. Estrogen and progesterone usually decrease immediately after delivering your baby. The level of thyroid hormone and various cortisol steroids also rapidly drop. Other factors that play a major role in these changes include major life events and genetics.  RISK FACTORS If you have any of the following risks for the baby blues or postpartum depression, know what symptoms to watch out for during the postpartum period. Risk factors that may increase the likelihood of getting the baby blues or postpartum depression include:  Havinga personal or family history of depression.  Having depression while being pregnant.  Having premenstrual or oral contraceptive-associated mood issues.  Having exceptional life stress.  Having marital conflict.  Lacking a social support network.  Having a baby with special needs.  Having health problems such as diabetes. SYMPTOMS Baby blues symptoms include:  Brief fluctuations in mood, such as going from extreme happiness to sadness.  Decreased concentration.  Difficulty sleeping.  Crying spells, tearfulness.  Irritability.  Anxiety. Postpartum depression symptoms typically begin within the  first month after giving birth. These symptoms include:  Difficulty sleeping or excessive sleepiness.  Marked weight loss.  Agitation.  Feelings of worthlessness.  Lack of interest in activity or food. Postpartum psychosis is a very concerning condition and can be dangerous. Fortunately, it is rare. Displaying any of the following symptoms is cause for immediate medical attention. Postpartum psychosis symptoms include:  Hallucinations and delusions.  Bizarre or disorganized behavior.  Confusion or disorientation. DIAGNOSIS  A diagnosis is made by an evaluation of your symptoms. There are no medical or lab tests that lead to a diagnosis, but there are various questionnaires that a caregiver may use to identify those with the baby blues, postpartum depression, or psychosis. Often times, a screening  tool called the [place] Postnatal Depression Scale is used to diagnose depression in the postpartum period.  TREATMENT The baby blues usually goes away on its own in 1 to 2 weeks. Social support is often all that is needed. You should be encouraged to get adequate sleep and rest. Occasionally, you may be given medicines to help you sleep.  Postpartum depression requires treatment as it can last several months or longer if it is not treated. Treatment may include individual or group therapy, medicine, or both to address any social, physiological, and psychological factors that may play a role in the depression. Regular exercise, a healthy diet, rest, and social support may also be strongly recommended.  Postpartum psychosis is more serious and needs treatment right away. Hospitalization is often needed. HOME CARE INSTRUCTIONS  Get as much rest as you can. Nap when the baby sleeps.  Exercise regularly. Some women find yoga and walking to be beneficial.  Eat a balanced and nourishing diet.  Do little things that you enjoy. Have a cup of tea, take a bubble bath, read your favorite magazine, or  listen to your favorite music.  Avoid alcohol.  Ask for help with household chores, cooking, grocery shopping, or running errands as needed. Do not try to do everything.  Talk to people close to you about how you are feeling. Get support from your partner, family members, friends, or other new moms.  Try to stay positive in how you think. Think about the things you are grateful for.  Do not spend a lot of time alone.  Only take medicine as directed by your caregiver.  Keep all your postpartum appointments.  Let your caregiver know if you have any concerns. SEEK MEDICAL CARE IF: You are having a reaction or problems with your medicine. SEEK IMMEDIATE MEDICAL CARE IF:  You have suicidal feelings.  You feel you may harm the baby or someone else. Document Released: 05/21/2004 Document Revised: 11/09/2011 Document Reviewed: 06/23/2011 [facility] Patient Information 2014 [place], [place].

## 2014-05-26 NOTE — Lactation Note (Signed)
This note was copied from the chart of Megan Johnson. Lactation Consultation Note  Patient Name: Megan Shaira Sova UEAVW'U Date: 05/26/2014 Reason for consult: Follow-up assessment;Late preterm infant;Infant < 6lbs;Infant weight loss (7 % and 2 day weight; supplement indicated) Mom still needing latch assistance and baby is now receiving formula supplement  if ebm not sufficient, due to weight loss >7% and LPI.  LC assisted baby to re-latch after baby had both breast and formula/bottle-fed but still "cuing" to feed.  Baby latched easily in cross-cradle position on (L) but needed intermittent stimulation to sustain effective sucking bursts and swallows.  Mom had obtained 8 ml's at most recent pumping but states she has not been pumping every 3 hours.  LC encouraged mom to use DEBP for at least 15 minutes q3h unless baby content without supplement.  LC reviewed milk production, supply and demand and LPI needs, as well as both stimulation and calming techniques.  PGM in room but sitting quietly in corner of room.  FOB very supportive and willing to help but both parents verbalize concern with her weight loss and need for supplement.     Maternal Data    Feeding Feeding Type: Breast Fed Length of feed: 13 min  LATCH Score/Interventions Latch: Repeated attempts needed to sustain latch, nipple held in mouth throughout feeding, stimulation needed to elicit sucking reflex. Intervention(s): Adjust position;Assist with latch;Breast compression  Audible Swallowing: Spontaneous and intermittent Intervention(s): Alternate breast massage  Type of Nipple: Everted at rest and after stimulation  Comfort (Breast/Nipple): Soft / non-tender     Hold (Positioning): Assistance needed to correctly position infant at breast and maintain latch. Intervention(s): Breastfeeding basics reviewed;Support Pillows;Position options;Skin to skin (reviewed cue and cluster feeding, waking/stimulating and comfort  measures)  LATCH Score: 8 (with LC)  Lactation Tools Discussed/Used Breast pump type: Double-Electric Breast Pump Waking and calming methods Cue and cluster feedings  Consult Status Consult Status: Follow-up Date: 05/27/14 Follow-up type: In-patient    Warrick Parisian [facility] 05/26/2014, 4:48 PM

## 2014-05-27 MED ORDER — IBUPROFEN 600 MG PO TABS: 600.0000 mg | ORAL_TABLET | Freq: Four times a day (QID) | ORAL | Status: DC

## 2014-05-27 MED ORDER — OXYCODONE-ACETAMINOPHEN 5-325 MG PO TABS: 2.0000 | ORAL_TABLET | ORAL | Status: DC | PRN

## 2014-05-27 NOTE — Lactation Note (Addendum)
This note was copied from the chart of Megan Johnson. Lactation Consultation Note  Patient Name: Megan Johnson [id number] Date: 05/27/2014 Reason for consult: Follow-up assessment - Per mom the Baby was up a lot last night cluster feeding.  My breast are heavier and sore feeling. Lc assessed breast tissue with moms permission and noted  Boarder line engorgement , areola softened and able to latch the baby onto the left breast with depth and the breast  Softened and the baby was able to sustain consistent pattern with multiply swallows, and gulps. Mom expressed feelings of being surprised The baby was latched so well and she was comfortable . LC reviewed basics , steps for latching , importance of breast massage, hand express,  prepump if needed for over fullness and breast compressions with latch until the baby is swallowing and then intermittent. Baby was still feeding at 15 mins. LC reviewed sore nipple and engorgement prevention and tx. Per mom has a DEBP at home, ( Medela ). LC referred to The Baby and me booklet if needed. Due to the Baby being at 7 % weight loss and 36 6/7 days LC recommended coming in for a LC O/P apt. Friday Oct. 2nd at 9 am. Mother informed of post-discharge support and given phone number to the lactation department, including services for phone call assistance; out-patient  appointments; and breastfeeding support group. List of other breastfeeding resources in the community given in the handout. Encouraged mother to call for problems or concerns related to breastfeeding.   Maternal Data    Feeding Feeding Type: Breast Fed Nipple Type: Slow - flow  LATCH Score/Interventions Latch: Grasps breast easily, tongue down, lips flanged, rhythmical sucking. Intervention(s): Adjust position;Assist with latch;Breast massage;Breast compression  Audible Swallowing: Spontaneous and intermittent  Type of Nipple: Everted at rest and after stimulation  Comfort  (Breast/Nipple): Filling, red/small blisters or bruises, mild/mod discomfort  Problem noted: Filling  Hold (Positioning): Assistance needed to correctly position infant at breast and maintain latch. Intervention(s): Breastfeeding basics reviewed;Support Pillows;Position options;Skin to skin  LATCH Score: 8  Lactation Tools Discussed/Used     Consult Status Consult Status: Follow-up Date: 06/01/14 Follow-up type: Out-patient (9am )    Megan Johnson 05/27/2014, 9:54 AM

## 2014-05-28 LAB — TYPE AND SCREEN
ABO/RH(D): O POS
ANTIBODY SCREEN: NEGATIVE
UNIT DIVISION: 0
UNIT DIVISION: 0
Unit division: 0
Unit division: 0

## 2014-06-01 ENCOUNTER — Ambulatory Visit (HOSPITAL_COMMUNITY): Payer: 59

## 2014-07-02 ENCOUNTER — Encounter (HOSPITAL_COMMUNITY): Payer: Self-pay | Admitting: Obstetrics & Gynecology

## 2015-07-19 ENCOUNTER — Emergency Department (HOSPITAL_BASED_OUTPATIENT_CLINIC_OR_DEPARTMENT_OTHER)

## 2015-07-19 ENCOUNTER — Emergency Department (HOSPITAL_BASED_OUTPATIENT_CLINIC_OR_DEPARTMENT_OTHER)
Admission: EM | Admit: 2015-07-19 | Discharge: 2015-07-20 | Disposition: A | Discharge status: Home / Self Care | Attending: Emergency Medicine | Admitting: Emergency Medicine

## 2015-07-19 ENCOUNTER — Encounter (HOSPITAL_BASED_OUTPATIENT_CLINIC_OR_DEPARTMENT_OTHER): Payer: Self-pay | Admitting: Emergency Medicine

## 2015-07-19 DIAGNOSIS — Y9289 Other specified places as the place of occurrence of the external cause: Secondary | ICD-10-CM | POA: Insufficient documentation

## 2015-07-19 DIAGNOSIS — S46911A Strain of unspecified muscle, fascia and tendon at shoulder and upper arm level, right arm, initial encounter: Secondary | ICD-10-CM

## 2015-07-19 DIAGNOSIS — X500XXA Overexertion from strenuous movement or load, initial encounter: Secondary | ICD-10-CM | POA: Insufficient documentation

## 2015-07-19 DIAGNOSIS — Y990 Civilian activity done for income or pay: Secondary | ICD-10-CM | POA: Insufficient documentation

## 2015-07-19 DIAGNOSIS — S46811A Strain of other muscles, fascia and tendons at shoulder and upper arm level, right arm, initial encounter: Secondary | ICD-10-CM | POA: Insufficient documentation

## 2015-07-19 DIAGNOSIS — Y9389 Activity, other specified: Secondary | ICD-10-CM | POA: Diagnosis not present

## 2015-07-19 DIAGNOSIS — S4991XA Unspecified injury of right shoulder and upper arm, initial encounter: Secondary | ICD-10-CM | POA: Diagnosis present

## 2015-07-19 NOTE — ED Notes (Signed)
Pt states at work and hurt Right shoulder

## 2015-07-20 NOTE — ED Provider Notes (Signed)
CSN: [id number]     Arrival date & time 07/19/15  2024 History   First MD Initiated Contact with Patient 07/19/15 2358     Chief Complaint  Patient presents with  . Shoulder Pain     (Consider location/radiation/quality/duration/timing/severity/associated sxs/prior Treatment) HPI Comments: Patient is a 29 year old female with no significant past medical history. She presents for evaluation of right shoulder pain. She states that she was lifting an object at work when she developed this pain. She denies any radiation into the arm. She denies any numbness, tingling, or weakness of the hand.  Patient is a 29 y.o. female presenting with shoulder pain. The history is provided by the patient.  Shoulder Pain Location:  Shoulder Time since incident:  12 hours Injury: yes   Shoulder location:  R shoulder Pain details:    Quality:  Sharp   Radiates to:  Does not radiate   Severity:  Moderate   Onset quality:  Sudden   Duration:  12 hours   Timing:  Constant   Progression:  Unchanged Chronicity:  New Dislocation: no     History reviewed. No pertinent past medical history. Past Surgical History  Procedure Laterality Date  . Wisdom tooth extraction    . Cesarean section N/A 05/24/2014    Procedure: CESAREAN SECTION;  Surgeon: Konrad FelixEma Wakuru Kulwa, MD;  Location: [facility];  Service: Obstetrics;  Laterality: N/A;   Family History  Problem Relation Age of Onset  . Alcohol abuse Neg Hx   . Arthritis Neg Hx   . Asthma Neg Hx   . Birth defects Neg Hx   . Cancer Neg Hx   . COPD Neg Hx   . Depression Neg Hx   . Diabetes Neg Hx   . Drug abuse Neg Hx   . Early death Neg Hx   . Hearing loss Neg Hx   . Heart disease Neg Hx   . Hyperlipidemia Neg Hx   . Kidney disease Neg Hx   . Hypertension Neg Hx   . Learning disabilities Neg Hx   . Mental illness Neg Hx   . Mental retardation Neg Hx   . Miscarriages / Stillbirths Neg Hx   . Stroke Neg Hx   . Vision loss Neg Hx   . Varicose Veins Neg Hx     Social History  Substance Use Topics  . Smoking status: Never Smoker   . Smokeless tobacco: Never Used  . Alcohol Use: No   OB History    Gravida Para Term Preterm AB TAB SAB Ectopic Multiple Living   1 1  1      1      Review of Systems  All other systems reviewed and are negative.     Allergies  Review of patient's allergies indicates no known allergies.  Home Medications   Prior to Admission medications   Not on File   BP 109/67 mmHg  Pulse 73  Temp(Src) 98.3 F (36.8 C) (Oral)  Resp 18  Ht 4\' 11"  (1.499 m)  Wt 116 lb (52.617 kg)  BMI 23.42 kg/m2  SpO2 99%  LMP 07/15/2015 Physical Exam  Constitutional: She is oriented to person, place, and time. She appears well-developed and well-nourished. No distress.  HENT:  Head: Normocephalic and atraumatic.  Neck: Normal range of motion. Neck supple.  Musculoskeletal:  There is tenderness to palpation over the right lateral deltoid. There is no obvious deformity. She has pain with abduction. Ulnar and radial pulses are easily palpable. She is  able to flex, extend, oppose all fingers. Sensation is intact throughout the entire hand.  Neurological: She is alert and oriented to person, place, and time.  Skin: Skin is warm and dry. She is not diaphoretic.  Nursing note and vitals reviewed.   ED Course  Procedures (including critical care time) Labs Review Labs Reviewed - No data to display  Imaging Review Dg Shoulder Right  07/19/2015  CLINICAL DATA:  Patient with sharp pain to humeral head while at work. Initial encounter. EXAM: RIGHT SHOULDER - 2+ VIEW COMPARISON:  None. FINDINGS: There is no evidence of fracture or dislocation. There is no evidence of arthropathy or other focal bone abnormality. Soft tissues are unremarkable. IMPRESSION: Negative. Electronically Signed   By: Annia Belt M.D.   On: 07/19/2015 21:32   I have personally reviewed and evaluated these images and lab results as part of my medical  decision-making.   EKG Interpretation None      MDM   Final diagnoses:  None    This appears to be shoulder strain. I will recommend ibuprofen 600 mg 3 times daily, rest, and follow up with primary doctor if not improving in the next week.    Geoffery Lyons, MD 07/20/15 [phone]

## 2015-07-20 NOTE — Discharge Instructions (Signed)
Ibuprofen 600 mg 3 times daily for the next 5 days.  Rest.  Follow-up with her primary doctor if you are not improving in the next week to discuss possibly further imaging or physical therapy.   Muscle Strain A muscle strain is an injury that occurs when a muscle is stretched beyond its normal length. Usually a small number of muscle fibers are torn when this happens. Muscle strain is rated in degrees. First-degree strains have the least amount of muscle fiber tearing and pain. Second-degree and third-degree strains have increasingly more tearing and pain.  Usually, recovery from muscle strain takes 1-2 weeks. Complete healing takes 5-6 weeks.  CAUSES  Muscle strain happens when a sudden, violent force placed on a muscle stretches it too far. This may occur with lifting, sports, or a fall.  RISK FACTORS Muscle strain is especially common in athletes.  SIGNS AND SYMPTOMS At the site of the muscle strain, there may be:  Pain.  Bruising.  Swelling.  Difficulty using the muscle due to pain or lack of normal function. DIAGNOSIS  Your health care provider will perform a physical exam and ask about your medical history. TREATMENT  Often, the best treatment for a muscle strain is resting, icing, and applying cold compresses to the injured area.  HOME CARE INSTRUCTIONS   Use the PRICE method of treatment to promote muscle healing during the first 2-3 days after your injury. The PRICE method involves:  Protecting the muscle from being injured again.  Restricting your activity and resting the injured body part.  Icing your injury. To do this, put ice in a plastic bag. Place a towel between your skin and the bag. Then, apply the ice and leave it on from 15-20 minutes each hour. After the third day, switch to moist heat packs.  Apply compression to the injured area with a splint or elastic bandage. Be careful not to wrap it too tightly. This may interfere with blood circulation or increase  swelling.  Elevate the injured body part above the level of your heart as often as you can.  Only take over-the-counter or prescription medicines for pain, discomfort, or fever as directed by your health care provider.  Warming up prior to exercise helps to prevent future muscle strains. SEEK MEDICAL CARE IF:   You have increasing pain or swelling in the injured area.  You have numbness, tingling, or a significant loss of strength in the injured area. MAKE SURE YOU:   Understand these instructions.  Will watch your condition.  Will get help right away if you are not doing well or get worse.   This information is not intended to replace advice given to you by your health care provider. Make sure you discuss any questions you have with your health care provider.   Document Released: 08/17/2005 Document Revised: 06/07/2013 Document Reviewed: 03/16/2013 Elsevier Interactive Patient Education [employer]2016 Elsevier Inc.

## 2016-01-16 ENCOUNTER — Inpatient Hospital Stay (HOSPITAL_COMMUNITY)
Admission: AD | Admit: 2016-01-16 | Discharge: 2016-01-16 | Disposition: A | Discharge status: Home / Self Care | Payer: 59 | Source: Ambulatory Visit | Attending: Obstetrics and Gynecology | Admitting: Obstetrics and Gynecology

## 2016-01-16 ENCOUNTER — Inpatient Hospital Stay (HOSPITAL_COMMUNITY): Payer: 59

## 2016-01-16 ENCOUNTER — Encounter (HOSPITAL_COMMUNITY): Payer: Self-pay | Admitting: *Deleted

## 2016-01-16 DIAGNOSIS — Z3A01 Less than 8 weeks gestation of pregnancy: Secondary | ICD-10-CM | POA: Insufficient documentation

## 2016-01-16 DIAGNOSIS — N939 Abnormal uterine and vaginal bleeding, unspecified: Secondary | ICD-10-CM

## 2016-01-16 DIAGNOSIS — Z98891 History of uterine scar from previous surgery: Secondary | ICD-10-CM

## 2016-01-16 DIAGNOSIS — O200 Threatened abortion: Secondary | ICD-10-CM | POA: Insufficient documentation

## 2016-01-16 LAB — CBC
HCT: 34.7 % — ABNORMAL LOW (ref 36.0–46.0)
Hemoglobin: 11.8 g/dL — ABNORMAL LOW (ref 12.0–15.0)
MCH: 31 pg (ref 26.0–34.0)
MCHC: 34 g/dL (ref 30.0–36.0)
MCV: 91.1 fL (ref 78.0–100.0)
PLATELETS: 292 10*3/uL (ref 150–400)
RBC: 3.81 MIL/uL — ABNORMAL LOW (ref 3.87–5.11)
RDW: 12.2 % (ref 11.5–15.5)
WBC: 10.2 10*3/uL (ref 4.0–10.5)

## 2016-01-16 LAB — WET PREP, GENITAL
Clue Cells Wet Prep HPF POC: NONE SEEN
Sperm: NONE SEEN
TRICH WET PREP: NONE SEEN
WBC, Wet Prep HPF POC: NONE SEEN
Yeast Wet Prep HPF POC: NONE SEEN

## 2016-01-16 LAB — HCG, QUANTITATIVE, PREGNANCY: hCG, Beta Chain, Quant, S: 805 m[IU]/mL — ABNORMAL HIGH (ref <5)

## 2016-01-16 MED ORDER — ONDANSETRON 4 MG PO TBDP: 4.0000 mg | ORAL_TABLET | Freq: Three times a day (TID) | ORAL | Status: DC | PRN

## 2016-01-16 MED ORDER — OXYCODONE-ACETAMINOPHEN 2.5-325 MG PO TABS: 1.0000 | ORAL_TABLET | ORAL | Status: DC | PRN

## 2016-01-16 NOTE — MAU Provider Note (Signed)
Megan Johnson is a 30 yo, G2P0101 with LMP of  12/03/15 presenting unannounced to MAU for vaginal bleeding and cramping that has worsened.  Pt [facility] and was seen earlier in the day for irregular bleeding and cramping.Pt reports a positive pregnancy test on 01/06/16 and had period like cramping with pain rated 7/10  And brownish vaginal discharge this morning.   7069w2d pregnancy by dates.    History     Patient Active Problem List   Diagnosis Date Noted  . Threatened miscarriage in early pregnancy 01/16/2016  . Hx of cesarean section 01/16/2016  . Hemorrhoid 03/01/2014    No chief complaint on file.  HPI  OB History    Gravida Para Term Preterm AB TAB SAB Ectopic Multiple Living   1 1  1      1       No past medical history on file.  Past Surgical History  Procedure Laterality Date  . Wisdom tooth extraction    . Cesarean section N/A 05/24/2014    Procedure: CESAREAN SECTION;  Surgeon: Konrad FelixEma Wakuru Kulwa, MD;  Location: [facility];  Service: Obstetrics;  Laterality: N/A;    Family History  Problem Relation Age of Onset  . Alcohol abuse Neg Hx   . Arthritis Neg Hx   . Asthma Neg Hx   . Birth defects Neg Hx   . Cancer Neg Hx   . COPD Neg Hx   . Depression Neg Hx   . Diabetes Neg Hx   . Drug abuse Neg Hx   . Early death Neg Hx   . Hearing loss Neg Hx   . Heart disease Neg Hx   . Hyperlipidemia Neg Hx   . Kidney disease Neg Hx   . Hypertension Neg Hx   . Learning disabilities Neg Hx   . Mental illness Neg Hx   . Mental retardation Neg Hx   . Miscarriages / Stillbirths Neg Hx   . Stroke Neg Hx   . Vision loss Neg Hx   . Varicose Veins Neg Hx     Social History  Substance Use Topics  . Smoking status: Never Smoker   . Smokeless tobacco: Never Used  . Alcohol Use: No    Allergies: No Known Allergies  No prescriptions prior to admission    ROS Physical Exam   Blood pressure 105/62, pulse 76, temperature 98.6 F (37 C),  temperature source Oral, resp. rate 16, height 4\' 11"  (1.499 m), weight 53.071 kg (117 lb), last menstrual period 12/03/2015, SpO2 98 %, unknown if currently breastfeeding.  Results for orders placed or performed during the hospital encounter of 01/16/16 (from the past 24 hour(s))  CBC     Status: Abnormal   Collection Time: 01/16/16  9:47 PM  Result Value Ref Range   WBC 10.2 4.0 - 10.5 K/uL   RBC 3.81 (L) 3.87 - 5.11 MIL/uL   Hemoglobin 11.8 (L) 12.0 - 15.0 g/dL   HCT 04.534.7 (L) 40.936.0 - 81.146.0 %   MCV 91.1 78.0 - 100.0 fL   MCH 31.0 26.0 - 34.0 pg   MCHC 34.0 30.0 - 36.0 g/dL   RDW 91.412.2 78.211.5 - 95.615.5 %   Platelets 292 150 - 400 K/uL  hCG, quantitative, pregnancy     Status: Abnormal   Collection Time: 01/16/16  9:47 PM  Result Value Ref Range   hCG, Beta Chain, Quant, S 805 (H) <5 mIU/mL  Wet prep, genital  Status: None   Collection Time: 01/16/16 11:00 PM  Result Value Ref Range   Yeast Wet Prep HPF POC NONE SEEN NONE SEEN   Trich, Wet Prep NONE SEEN NONE SEEN   Clue Cells Wet Prep HPF POC NONE SEEN NONE SEEN   WBC, Wet Prep HPF POC NONE SEEN NONE SEEN   Sperm NONE SEEN    [place]: FINDINGS: Intrauterine gestational sac: There is a 4 cm cystic structure within the thickened endometrium which may represent an early intrauterine gestational sac, which by size corresponds to 5 weeks and 0 day gestation.  Yolk sac: Not seen  Embryo: Not seen  MSD: 3.4 mm 5 w 0 d  [place] EDC: 09/08/2016  Maternal uterus/adnexae: Normal in appearance.  IMPRESSION: Probable early intrauterine pregnancy with gestational sac corresponding to 5 weeks and 0 days 2 station. Short-term follow-up is recommended.  Physical Exam  Constitutional: She is oriented to person, place, and time. She appears well-developed.  HENT:  Head: Normocephalic.  Eyes: Pupils are equal, round, and reactive to light.  Neck: Normal range of motion.  Cardiovascular: Normal rate and regular rhythm.   Respiratory:  Effort normal.  GI: Soft.  Genitourinary: There is bleeding in the vagina.  Frank red blood pooling in vaginal canal   Neurological: She is alert and oriented to person, place, and time.  Skin: Skin is warm and dry.  Psychiatric: She has a normal mood and affect. Her behavior is normal.    ED Course  Assessment: Probable IUP at 5.0 wks by [place] B-HCG 805  Likely  threatened miscarriage  Plan:   F/U B-HCG in two days  (5/21 in PM) at [facility] F/u US at [facility] in 1 week  Bleeding precautions discussed Rx Percocet for pain/cramping  Call PRN  Alphonzo Severance CNM, MSN 01/16/2016 9:15 PM

## 2016-01-16 NOTE — MAU Note (Addendum)
PT SAYS SHE WAS AT OFFICE AT 2PM-  POSITIVE PREG,   LABS  AND   URINE.     WENT  TO OFFICE  BC STARTED HAVING  CRAMPS LAST NIGHT.       SHE HAS BROWN D/C -  STARTED   THIS AM  BUT  AT 6PM BECAME RED   THEN WENT TO B-ROOM  AND SAW BLOOD IN TOILET  .    SHE TOOK   1000MG    OF   TYLENOL  AT   10AM   AND 515PM.-    NO RELIEF

## 2016-01-16 NOTE — MAU Note (Signed)
Pt reports she had a positive home preg test at home and was seen at MD office today due to cramping and bleeding and had blood work done. States the pain is worsening

## 2016-01-16 NOTE — Discharge Instructions (Signed)
Amenaza de aborto °(Threatened Miscarriage) °La amenaza de aborto se produce cuando hay hemorragia vaginal durante las primeras 20 semanas de embarazo, pero el embarazo no se interrumpe. Si durante este período usted tiene hemorragia vaginal, el médico le hará pruebas para asegurarse de que el embarazo continúe. Si las pruebas muestran que usted continúa embarazada y que el "bebé" en desarrollo (feto) dentro del útero sigue creciendo, se considera que tuvo una amenaza de aborto. °La amenaza de aborto no implica que el embarazo vaya a terminar, pero sí aumenta el riesgo de perder el embarazo (aborto completo). °CAUSAS  °Por lo general, no se conoce la causa de la amenaza de aborto. Si el resultado final es el aborto completo, la causa más frecuente es la cantidad anormal de cromosomas del feto. Los cromosomas son las estructuras internas de las células que contienen todo el material genético. °Algunas de las causas de hemorragia vaginal que no ocasionan un aborto incluyen: °· Las relaciones sexuales. °· Las infecciones. °· Los cambios hormonales normales durante el embarazo. °· La hemorragia que se produce cuando el óvulo se implanta en el útero. °FACTORES DE RIESGO °Los factores de riesgo de hemorragia al principio del embarazo incluyen: °· Obesidad. °· Fumar. °· El consumo de cantidades excesivas de alcohol o cafeína. °· El consumo de drogas. °SIGNOS Y SÍNTOMAS °· Hemorragia vaginal leve. °· Dolor o cólicos abdominales leves. °DIAGNÓSTICO  °Si tiene hemorragia con o sin dolor abdominal antes de las 20 semanas de embarazo, el médico le hará pruebas para determinar si el embarazo continúa. Una prueba importante incluye el uso de ondas sonoras y de una computadora (ecografía) para crear imágenes del interior del útero. Otras pruebas incluyen el examen interno de la vagina y el útero (examen pélvico), y el control de la frecuencia cardíaca del feto.  °Es posible que le diagnostiquen una amenaza de aborto en los  siguientes casos: °· La ecografía muestra que el embarazo continúa. °· La frecuencia cardíaca del feto es alta. °· El examen pélvico muestra que la apertura entre el útero y la vagina (cuello del útero) está cerrada. °· Su frecuencia cardíaca y su presión arterial están estables. °· Los análisis de sangre confirman que el embarazo continúa. °TRATAMIENTO  °No se ha demostrado que ningún tratamiento evite que una amenaza de aborto se convierta en un aborto completo. Sin embargo, los cuidados adecuados en el hogar son importantes.  °INSTRUCCIONES PARA EL CUIDADO EN EL HOGAR  °· Asegúrese de asistir a todas las citas de cuidados prenatales. Esto es muy importante. °· Descanse lo suficiente. °· No tenga relaciones sexuales ni use tampones si tiene hemorragia vaginal. °· No se haga duchas vaginales. °· No fume ni consuma drogas. °· No beba alcohol. °· Evite la cafeína. °SOLICITE ATENCIÓN MÉDICA SI: °· Tiene una ligera hemorragia o manchado vaginal durante el embarazo. °· Tiene dolor o cólicos en el abdomen. °· Tiene fiebre. °SOLICITE ATENCIÓN MÉDICA DE INMEDIATO SI: °· Tiene una hemorragia vaginal abundante. °· Elimina coágulos de sangre por la vagina. °· Siente dolor en la parte baja de la espalda o cólicos abdominales intensos. °· Tiene fiebre, escalofríos y dolor abdominal intenso. °ASEGÚRESE DE QUE: °· Comprende estas instrucciones. °· Controlará su afección. °· Recibirá ayuda de inmediato si no mejora o si empeora. °  °Esta información no tiene como fin reemplazar el consejo del médico. Asegúrese de hacerle al médico cualquier pregunta que tenga. °  °Document Released: 05/27/2005 Document Revised: 08/22/2013 °Elsevier Interactive Patient Education ©2016 Elsevier Inc. ° °

## 2016-01-17 ENCOUNTER — Other Ambulatory Visit: Payer: Self-pay | Admitting: Certified Nurse Midwife

## 2016-01-18 ENCOUNTER — Inpatient Hospital Stay (HOSPITAL_COMMUNITY)
Admission: AD | Admit: 2016-01-18 | Discharge: 2016-01-18 | Disposition: A | Discharge status: Home / Self Care | Payer: 59 | Source: Ambulatory Visit | Attending: Obstetrics and Gynecology | Admitting: Obstetrics and Gynecology

## 2016-01-18 ENCOUNTER — Encounter (HOSPITAL_COMMUNITY): Payer: Self-pay | Admitting: *Deleted

## 2016-01-18 DIAGNOSIS — O039 Complete or unspecified spontaneous abortion without complication: Secondary | ICD-10-CM | POA: Diagnosis present

## 2016-01-18 LAB — HCG, QUANTITATIVE, PREGNANCY: hCG, Beta Chain, Quant, S: 167 m[IU]/mL — ABNORMAL HIGH (ref <5)

## 2016-01-18 NOTE — MAU Note (Addendum)
Sherre ScarletKimberly Williams CNM in Triage to discuss test results with pt and then pt d/c home from Triage with family

## 2016-01-18 NOTE — MAU Note (Signed)
Here for f/u BHCG. Denies any problems.

## 2016-01-18 NOTE — Progress Notes (Signed)
Thornell MuleK. Williams CNM notified of pt's BHCG tonight and 2 days ago with no complaints tonight.

## 2016-01-22 NOTE — MAU Provider Note (Signed)
Recent Results (from the past 2160 hour(s))  CBC     Status: Abnormal   Collection Time: 01/16/16  9:47 PM  Result Value Ref Range   WBC 10.2 4.0 - 10.5 K/uL   RBC 3.81 (L) 3.87 - 5.11 MIL/uL   Hemoglobin 11.8 (L) 12.0 - 15.0 g/dL   HCT 63.834.7 (L) 75.636.0 - 43.346.0 %   MCV 91.1 78.0 - 100.0 fL   MCH 31.0 26.0 - 34.0 pg   MCHC 34.0 30.0 - 36.0 g/dL   RDW 29.512.2 18.811.5 - 41.615.5 %   Platelets 292 150 - 400 K/uL  hCG, quantitative, pregnancy     Status: Abnormal   Collection Time: 01/16/16  9:47 PM  Result Value Ref Range   hCG, Beta Chain, Quant, S 805 (H) <5 mIU/mL    Comment:          GEST. AGE      CONC.  (mIU/mL)   <=1 WEEK        5 - 50     2 WEEKS       50 - 500     3 WEEKS       100 - 10,000     4 WEEKS     1,000 - 30,000     5 WEEKS     3,500 - 115,000   6-8 WEEKS     12,000 - 270,000    12 WEEKS     15,000 - 220,000        FEMALE AND NON-PREGNANT FEMALE:     LESS THAN 5 mIU/mL   Wet prep, genital     Status: None   Collection Time: 01/16/16 11:00 PM  Result Value Ref Range   Yeast Wet Prep HPF POC NONE SEEN NONE SEEN   Trich, Wet Prep NONE SEEN NONE SEEN   Clue Cells Wet Prep HPF POC NONE SEEN NONE SEEN   WBC, Wet Prep HPF POC NONE SEEN NONE SEEN    Comment: MODERATE BACTERIA SEEN   Sperm NONE SEEN   hCG, quantitative, pregnancy     Status: Abnormal   Collection Time: 01/18/16  8:52 PM  Result Value Ref Range   hCG, Beta Chain, Quant, S 167 (H) <5 mIU/mL    Comment:          GEST. AGE      CONC.  (mIU/mL)   <=1 WEEK        5 - 50     2 WEEKS       50 - 500     3 WEEKS       100 - 10,000     4 WEEKS     1,000 - 30,000     5 WEEKS     3,500 - 115,000   6-8 WEEKS     12,000 - 270,000    12 WEEKS     15,000 - 220,000        FEMALE AND NON-PREGNANT FEMALE:     LESS THAN 5 mIU/mL    Pt here for repeat HCG. Was 805 on 01/16/16, now 167. Pt informed of likely miscarriage. Advised to keep office appointment next week. She is to return to MAU if saturates more than 2 pads in 1 hr.  May take Tylenol or Ibuprofen for cramping. Pt w/o bleeding or cramping at present.   Sherre ScarletKimberly Boykin Baetz, CNM 01/18/16, 10:14 PM

## 2016-04-28 ENCOUNTER — Other Ambulatory Visit: Payer: Self-pay | Admitting: Obstetrics and Gynecology

## 2016-04-28 ENCOUNTER — Other Ambulatory Visit (HOSPITAL_COMMUNITY)
Admission: RE | Admit: 2016-04-28 | Discharge: 2016-04-28 | Disposition: A | Discharge status: Home / Self Care | Payer: 59 | Source: Ambulatory Visit | Attending: Obstetrics and Gynecology | Admitting: Obstetrics and Gynecology

## 2016-04-28 DIAGNOSIS — Z01419 Encounter for gynecological examination (general) (routine) without abnormal findings: Secondary | ICD-10-CM | POA: Insufficient documentation

## 2016-04-28 DIAGNOSIS — Z113 Encounter for screening for infections with a predominantly sexual mode of transmission: Secondary | ICD-10-CM | POA: Insufficient documentation

## 2016-04-29 ENCOUNTER — Other Ambulatory Visit (HOSPITAL_COMMUNITY): Payer: Self-pay | Admitting: Obstetrics and Gynecology

## 2016-04-29 DIAGNOSIS — IMO0002 Reserved for concepts with insufficient information to code with codable children: Secondary | ICD-10-CM

## 2016-04-30 LAB — CYTOLOGY - PAP

## 2016-05-05 ENCOUNTER — Ambulatory Visit (HOSPITAL_COMMUNITY)
Admission: RE | Admit: 2016-05-05 | Discharge: 2016-05-05 | Disposition: A | Discharge status: Home / Self Care | Payer: 59 | Source: Ambulatory Visit | Attending: Obstetrics and Gynecology | Admitting: Obstetrics and Gynecology

## 2016-05-05 DIAGNOSIS — Z36 Encounter for antenatal screening of mother: Secondary | ICD-10-CM | POA: Diagnosis present

## 2016-05-05 DIAGNOSIS — IMO0002 Reserved for concepts with insufficient information to code with codable children: Secondary | ICD-10-CM

## 2016-05-05 DIAGNOSIS — Z3A09 9 weeks gestation of pregnancy: Secondary | ICD-10-CM | POA: Insufficient documentation

## 2016-07-30 ENCOUNTER — Encounter (HOSPITAL_COMMUNITY): Payer: Self-pay | Admitting: *Deleted

## 2016-07-30 ENCOUNTER — Inpatient Hospital Stay (HOSPITAL_COMMUNITY)
Admission: AD | Admit: 2016-07-30 | Discharge: 2016-07-30 | Disposition: A | Discharge status: Home / Self Care | Payer: 59 | Source: Ambulatory Visit | Attending: Obstetrics and Gynecology | Admitting: Obstetrics and Gynecology

## 2016-07-30 DIAGNOSIS — Z3A22 22 weeks gestation of pregnancy: Secondary | ICD-10-CM | POA: Diagnosis not present

## 2016-07-30 DIAGNOSIS — O26892 Other specified pregnancy related conditions, second trimester: Secondary | ICD-10-CM | POA: Diagnosis not present

## 2016-07-30 DIAGNOSIS — R109 Unspecified abdominal pain: Secondary | ICD-10-CM | POA: Diagnosis not present

## 2016-07-30 LAB — URINALYSIS, ROUTINE W REFLEX MICROSCOPIC
Bilirubin Urine: NEGATIVE
Glucose, UA: NEGATIVE mg/dL
Ketones, ur: NEGATIVE mg/dL
Leukocytes, UA: NEGATIVE
NITRITE: NEGATIVE
PH: 6.5 (ref 5.0–8.0)
Protein, ur: NEGATIVE mg/dL
SPECIFIC GRAVITY, URINE: 1.02 (ref 1.005–1.030)

## 2016-07-30 LAB — URINE MICROSCOPIC-ADD ON

## 2016-07-30 NOTE — MAU Note (Signed)
Pt C/O lower abd/pelvic pain since 0400 this a.m.  Denies bleeding or LOF.  Has nausea, no vomiting or diarrhea.  Took some tylenol for pain but it didn't help.

## 2016-07-30 NOTE — Discharge Instructions (Signed)
Abdominal Pain During Pregnancy  Abdominal pain is common in pregnancy. Most of the time, it does not cause harm. There are many causes of abdominal pain. Some causes are more serious than others and sometimes the cause is not known. Abdominal pain can be a sign that something is very wrong with the pregnancy or the pain may have nothing to do with the pregnancy. Always tell your health care provider if you have any abdominal pain.  Follow these instructions at home:  · Do not have sex or put anything in your vagina until your symptoms go away completely.  · Watch your abdominal pain for any changes.  · Get plenty of rest until your pain improves.  · Drink enough fluid to keep your urine clear or pale yellow.  · Take over-the-counter or prescription medicines only as told by your health care provider.  · Keep all follow-up visits as told by your health care provider. This is important.  Contact a health care provider if:  · You have a fever.  · Your pain gets worse or you have cramping.  · Your pain continues after resting.  Get help right away if:  · You are bleeding, leaking fluid, or passing tissue from the vagina.  · You have vomiting or diarrhea that does not go away.  · You have painful or bloody urination.  · You notice a decrease in your baby's movements.  · You feel very weak or faint.  · You have shortness of breath.  · You develop a severe headache with abdominal pain.  · You have abnormal vaginal discharge with abdominal pain.  This information is not intended to replace advice given to you by your health care provider. Make sure you discuss any questions you have with your health care provider.  Document Released: 08/17/2005 Document Revised: 05/28/2016 Document Reviewed: 03/16/2013  Elsevier Interactive Patient Education © 2017 Elsevier Inc.

## 2016-07-30 NOTE — MAU Provider Note (Signed)
History     CSN: [id number]  Arrival date and time: 07/30/16 1337   First Provider Initiated Contact with Patient 07/30/16 1432      Chief Complaint  Patient presents with  . Abdominal Pain   HPI Megan Johnson is a 30 y.o. [phone] at 6794w1d who presents with abdominal pain. Pain began at 4 am this morning. Initially pain was constant but has since been intermittent and occurring every 4 minutes. Describes as sharp & located in the suprapubic/pelvic area. Rates pain 6/10. Has not treated. Nothing makes better or worse. Some nausea, no vomiting. Denies diarrhea, vaginal bleeding, LOF, or dysuria. Last BM was 3 days ago; states this is normal frequency for her. Positive fetal movement.   OB History    Gravida Para Term Preterm AB Living   3 1   1 1 1    SAB TAB Ectopic Multiple Live Births   1       1      History reviewed. No pertinent past medical history.  Past Surgical History:  Procedure Laterality Date  . CESAREAN SECTION N/A 05/24/2014   Procedure: CESAREAN SECTION;  Surgeon: Konrad FelixEma Wakuru Kulwa, MD;  Location: [facility];  Service: Obstetrics;  Laterality: N/A;  . WISDOM TOOTH EXTRACTION      Family History  Problem Relation Age of Onset  . Alcohol abuse Neg Hx   . Arthritis Neg Hx   . Asthma Neg Hx   . Birth defects Neg Hx   . Cancer Neg Hx   . COPD Neg Hx   . Depression Neg Hx   . Diabetes Neg Hx   . Drug abuse Neg Hx   . Early death Neg Hx   . Hearing loss Neg Hx   . Heart disease Neg Hx   . Hyperlipidemia Neg Hx   . Kidney disease Neg Hx   . Hypertension Neg Hx   . Learning disabilities Neg Hx   . Mental illness Neg Hx   . Mental retardation Neg Hx   . Miscarriages / Stillbirths Neg Hx   . Stroke Neg Hx   . Vision loss Neg Hx   . Varicose Veins Neg Hx     Social History  Substance Use Topics  . Smoking status: Never Smoker  . Smokeless tobacco: Never Used  . Alcohol use No    Allergies: No Known Allergies  Prescriptions Prior to Admission   Medication Sig Dispense Refill Last Dose  . acetaminophen (TYLENOL) 500 MG tablet Take 1,000 mg by mouth every 6 (six) hours as needed for moderate pain.    07/30/2016 at Unknown time  . Prenatal Vit-Fe Fumarate-FA (PRENATAL MULTIVITAMIN) TABS tablet Take 1 tablet by mouth daily at 12 noon.   07/29/2016 at Unknown time  . ondansetron (ZOFRAN ODT) 4 MG disintegrating tablet Take 1 tablet (4 mg total) by mouth every 8 (eight) hours as needed for nausea or vomiting. (Patient not taking: Reported on 07/30/2016) 20 tablet 0 Not Taking at Unknown time  . oxycodone-acetaminophen (PERCOCET) 2.5-325 MG tablet Take 1 tablet by mouth every 4 (four) hours as needed for pain. (Patient not taking: Reported on 07/30/2016) 10 tablet 0 Not Taking at Unknown time    Review of Systems  Constitutional: Negative.   Gastrointestinal: Positive for abdominal pain and nausea. Negative for constipation, diarrhea and vomiting.  Genitourinary: Negative for dysuria, frequency and hematuria.       No vaginal bleeding, discharge, or LOF   Physical Exam  Blood pressure 104/56, pulse 89, temperature 98.2 F (36.8 C), temperature source Oral, resp. rate 18, unknown if currently breastfeeding.  Physical Exam  Nursing note and vitals reviewed. Constitutional: She is oriented to person, place, and time. She appears well-developed and well-nourished. No distress.  HENT:  Head: Normocephalic and atraumatic.  Eyes: Conjunctivae are normal. Right eye exhibits no discharge. Left eye exhibits no discharge. No scleral icterus.  Neck: Normal range of motion.  Cardiovascular: Normal rate, regular rhythm and normal heart sounds.   No murmur heard. Respiratory: Effort normal and breath sounds normal. No respiratory distress. She has no wheezes.  GI: Soft. Bowel sounds are normal. There is no tenderness.  Neurological: She is alert and oriented to person, place, and time.  Skin: Skin is warm and dry. She is not diaphoretic.   Psychiatric: She has a normal mood and affect. Her behavior is normal. Judgment and thought content normal.   Dilation: Closed Effacement (%): Thick Exam by:: Estanislado SpireE. Noni Stonesifer NP   MAU Course  Procedures Results for orders placed or performed during the hospital encounter of 07/30/16 (from the past 24 hour(s))  Urinalysis, Routine w reflex microscopic ([facility])     Status: Abnormal   Collection Time: 07/30/16  2:20 PM  Result Value Ref Range   Color, Urine YELLOW YELLOW   APPearance CLEAR CLEAR   Specific Gravity, Urine 1.020 1.005 - 1.030   pH 6.5 5.0 - 8.0   Glucose, UA NEGATIVE NEGATIVE mg/dL   Hgb urine dipstick SMALL (A) NEGATIVE   Bilirubin Urine NEGATIVE NEGATIVE   Ketones, ur NEGATIVE NEGATIVE mg/dL   Protein, ur NEGATIVE NEGATIVE mg/dL   Nitrite NEGATIVE NEGATIVE   Leukocytes, UA NEGATIVE NEGATIVE  Urine microscopic-add on     Status: Abnormal   Collection Time: 07/30/16  2:20 PM  Result Value Ref Range   Squamous Epithelial / LPF 6-30 (A) NONE SEEN   WBC, UA 0-5 0 - 5 WBC/hpf   RBC / HPF 0-5 0 - 5 RBC/hpf   Bacteria, UA FEW (A) NONE SEEN    MDM FHT 147 by doppler Cervix closed No contraction palpated nor on TOCO Urine sent for culture S/w Dr. Richardson Doppole -- ok to discharge home Assessment and Plan  A: 1. Abdominal pain during pregnancy in second trimester    P: Discharge home Discussed staying well hydrated Take tylenol prn pain Urine culture pending Keep f/u with OB Preterm labor precautions  Judeth Hornrin Dayveon Halley 07/30/2016, 2:32 PM

## 2016-07-31 LAB — CULTURE, OB URINE: Culture: >=100000 — AB

## 2016-09-11 ENCOUNTER — Inpatient Hospital Stay (HOSPITAL_COMMUNITY)
Admission: AD | Admit: 2016-09-11 | Discharge: 2016-09-11 | Disposition: A | Discharge status: Home / Self Care | Payer: 59 | Source: Ambulatory Visit | Attending: Obstetrics and Gynecology | Admitting: Obstetrics and Gynecology

## 2016-09-11 ENCOUNTER — Encounter (HOSPITAL_COMMUNITY): Payer: Self-pay

## 2016-09-11 DIAGNOSIS — O26893 Other specified pregnancy related conditions, third trimester: Secondary | ICD-10-CM | POA: Insufficient documentation

## 2016-09-11 DIAGNOSIS — O26899 Other specified pregnancy related conditions, unspecified trimester: Secondary | ICD-10-CM

## 2016-09-11 DIAGNOSIS — R102 Pelvic and perineal pain: Secondary | ICD-10-CM | POA: Insufficient documentation

## 2016-09-11 DIAGNOSIS — Z3A29 29 weeks gestation of pregnancy: Secondary | ICD-10-CM

## 2016-09-11 DIAGNOSIS — R1030 Lower abdominal pain, unspecified: Secondary | ICD-10-CM | POA: Diagnosis present

## 2016-09-11 DIAGNOSIS — Z3A28 28 weeks gestation of pregnancy: Secondary | ICD-10-CM | POA: Insufficient documentation

## 2016-09-11 LAB — URINALYSIS, ROUTINE W REFLEX MICROSCOPIC
BILIRUBIN URINE: NEGATIVE
GLUCOSE, UA: NEGATIVE mg/dL
KETONES UR: NEGATIVE mg/dL
NITRITE: NEGATIVE
PROTEIN: NEGATIVE mg/dL
Specific Gravity, Urine: 1.011 (ref 1.005–1.030)
pH: 8 (ref 5.0–8.0)

## 2016-09-11 NOTE — Discharge Instructions (Signed)
Abdominal Pain During Pregnancy  Abdominal pain is common in pregnancy. Most of the time, it does not cause harm. There are many causes of abdominal pain. Some causes are more serious than others and sometimes the cause is not known. Abdominal pain can be a sign that something is very wrong with the pregnancy or the pain may have nothing to do with the pregnancy. Always tell your health care provider if you have any abdominal pain.  Follow these instructions at home:  · Do not have sex or put anything in your vagina until your symptoms go away completely.  · Watch your abdominal pain for any changes.  · Get plenty of rest until your pain improves.  · Drink enough fluid to keep your urine clear or pale yellow.  · Take over-the-counter or prescription medicines only as told by your health care provider.  · Keep all follow-up visits as told by your health care provider. This is important.  Contact a health care provider if:  · You have a fever.  · Your pain gets worse or you have cramping.  · Your pain continues after resting.  Get help right away if:  · You are bleeding, leaking fluid, or passing tissue from the vagina.  · You have vomiting or diarrhea that does not go away.  · You have painful or bloody urination.  · You notice a decrease in your baby's movements.  · You feel very weak or faint.  · You have shortness of breath.  · You develop a severe headache with abdominal pain.  · You have abnormal vaginal discharge with abdominal pain.  This information is not intended to replace advice given to you by your health care provider. Make sure you discuss any questions you have with your health care provider.  Document Released: 08/17/2005 Document Revised: 05/28/2016 Document Reviewed: 03/16/2013  Elsevier Interactive Patient Education © 2017 Elsevier Inc.

## 2016-09-11 NOTE — MAU Note (Signed)
Onset of lower abdominal bilaterally since last night, denies vaginal bleeding.

## 2016-09-11 NOTE — MAU Provider Note (Signed)
Chief Complaint:  Abdominal Cramping   First Provider Initiated Contact with Patient 09/11/16 1118      HPI: Megan Johnson is a 31 y.o. [phone] [id number] at 7356w2d who presents to maternity admissions reporting onset of bilateral lower abdominal cramping. The pain is intermittent but comes on and lasts a couple of hours then goes away. She has not tried any treatments, nothing seems to make it better or worse. She denies changes in recent activity.  She reports good fetal movement, denies LOF, vaginal bleeding, vaginal itching/burning, urinary symptoms, h/a, dizziness, n/v, or fever/chills.    HPI  Past Medical History: History reviewed. No pertinent past medical history.  Past obstetric history: OB History  Gravida Para Term Preterm AB Living  3 1   1 1 1   SAB TAB Ectopic Multiple Live Births  1       1    # Outcome Date GA Lbr Len/2nd Weight Sex Delivery Anes PTL Lv  3 Current           2 Preterm 05/24/14 3738w6d  6 lb 5.2 oz (2.87 kg) F CS-LTranv Spinal, EPI  LIV  1 SAB               Past Surgical History: Past Surgical History:  Procedure Laterality Date  . CESAREAN SECTION N/A 05/24/2014   Procedure: CESAREAN SECTION;  Surgeon: Konrad FelixEma Wakuru Kulwa, MD;  Location: [facility];  Service: Obstetrics;  Laterality: N/A;  . WISDOM TOOTH EXTRACTION      Family History: Family History  Problem Relation Age of Onset  . Alcohol abuse Neg Hx   . Arthritis Neg Hx   . Asthma Neg Hx   . Birth defects Neg Hx   . Cancer Neg Hx   . COPD Neg Hx   . Depression Neg Hx   . Diabetes Neg Hx   . Drug abuse Neg Hx   . Early death Neg Hx   . Hearing loss Neg Hx   . Heart disease Neg Hx   . Hyperlipidemia Neg Hx   . Kidney disease Neg Hx   . Hypertension Neg Hx   . Learning disabilities Neg Hx   . Mental illness Neg Hx   . Mental retardation Neg Hx   . Miscarriages / Stillbirths Neg Hx   . Stroke Neg Hx   . Vision loss Neg Hx   . Varicose Veins Neg Hx     Social History: Social History   Substance Use Topics  . Smoking status: Never Smoker  . Smokeless tobacco: Never Used  . Alcohol use No    Allergies: No Known Allergies  Meds:  No prescriptions prior to admission.    ROS:  Review of Systems  Constitutional: Negative for chills, fatigue and fever.  Eyes: Negative for visual disturbance.  Respiratory: Negative for shortness of breath.   Cardiovascular: Negative for chest pain.  Gastrointestinal: Positive for abdominal pain. Negative for nausea and vomiting.  Genitourinary: Positive for pelvic pain. Negative for difficulty urinating, dysuria, flank pain, vaginal bleeding, vaginal discharge and vaginal pain.  Neurological: Negative for dizziness and headaches.  Psychiatric/Behavioral: Negative.      I have reviewed patient's Past Medical Hx, Surgical Hx, Family Hx, Social Hx, medications and allergies.   Physical Exam   Patient Vitals for the past 24 hrs:  BP Temp Temp src Pulse Resp Height Weight  09/11/16 1102 101/61 98.1 F (36.7 C) Oral 101 16 - -  09/11/16 1051 - - - - -  4\' 11"  (1.499 m) 147 lb (66.7 kg)   Constitutional: Well-developed, well-nourished female in no acute distress.  Cardiovascular: normal rate Respiratory: normal effort GI: Abd soft, non-tender, gravid appropriate for gestational age.  MS: Extremities nontender, no edema, normal ROM Neurologic: Alert and oriented x 4.  GU: Neg CVAT.   Dilation: Closed Effacement (%): Thick Cervical Position: Posterior Exam by:: Sharen Counter CNM  FHT:  Baseline 145, moderate variability, accelerations present, no decelerations Contractions: None on toco or to palpation   Labs: Results for orders placed or performed during the hospital encounter of 09/11/16 (from the past 24 hour(s))  Urinalysis, Routine w reflex microscopic     Status: Abnormal   Collection Time: 09/11/16 10:50 AM  Result Value Ref Range   Color, Urine YELLOW YELLOW   APPearance HAZY (A) CLEAR   Specific Gravity,  Urine 1.011 1.005 - 1.030   pH 8.0 5.0 - 8.0   Glucose, UA NEGATIVE NEGATIVE mg/dL   Hgb urine dipstick SMALL (A) NEGATIVE   Bilirubin Urine NEGATIVE NEGATIVE   Ketones, ur NEGATIVE NEGATIVE mg/dL   Protein, ur NEGATIVE NEGATIVE mg/dL   Nitrite NEGATIVE NEGATIVE   Leukocytes, UA SMALL (A) NEGATIVE   RBC / HPF 0-5 0 - 5 RBC/hpf   WBC, UA 0-5 0 - 5 WBC/hpf   Bacteria, UA RARE (A) NONE SEEN   Squamous Epithelial / LPF 6-30 (A) NONE SEEN   Mucous PRESENT       Imaging:  No results found.  MAU Course/MDM: I have ordered labs and reviewed results.  NST reviewed Consult Dr Charlotta Newton with presentation, exam findings and test results.  No evidence of preterm labor on today's exam.   Likely round ligament pain.   Rest/ice/heat/warm bath/Tylenol/pregnancy support belt for pain Pt to f/u in office as scheduled but return to MAU if symptoms worsen or persist Pt stable at time of discharge.  Today's evaluation included a work-up for preterm labor which can be life-threatening for both mom and baby.  Assessment: 1. Pain of round ligament affecting pregnancy, antepartum     Plan: Discharge home Labor precautions and fetal kick counts Follow-up Information    COLE,TARA J., MD Follow up.   Specialty:  Obstetrics and Gynecology Why:  As scheduled, return to MAU as needed for emergencies Contact information: [address] [place] [address] [phone]          Allergies as of 09/11/2016   No Known Allergies     Medication List    TAKE these medications   acetaminophen 500 MG tablet Commonly known as:  TYLENOL Take 1,000 mg by mouth every 6 (six) hours as needed for moderate pain.   prenatal multivitamin Tabs tablet Take 1 tablet by mouth daily at 12 noon.       Sharen Counter Certified Nurse-Midwife 09/11/2016 9:09 PM

## 2016-09-12 LAB — CULTURE, OB URINE: Culture: <10000 — AB

## 2016-09-18 ENCOUNTER — Inpatient Hospital Stay (HOSPITAL_COMMUNITY)
Admission: AD | Admit: 2016-09-18 | Discharge: 2016-09-18 | Disposition: A | Discharge status: Home / Self Care | Payer: 59 | Source: Ambulatory Visit | Attending: Obstetrics and Gynecology | Admitting: Obstetrics and Gynecology

## 2016-09-18 ENCOUNTER — Encounter (HOSPITAL_COMMUNITY): Payer: Self-pay

## 2016-09-18 DIAGNOSIS — O9989 Other specified diseases and conditions complicating pregnancy, childbirth and the puerperium: Secondary | ICD-10-CM

## 2016-09-18 DIAGNOSIS — O26893 Other specified pregnancy related conditions, third trimester: Secondary | ICD-10-CM | POA: Diagnosis present

## 2016-09-18 DIAGNOSIS — R109 Unspecified abdominal pain: Secondary | ICD-10-CM | POA: Diagnosis not present

## 2016-09-18 DIAGNOSIS — O26899 Other specified pregnancy related conditions, unspecified trimester: Secondary | ICD-10-CM

## 2016-09-18 DIAGNOSIS — M549 Dorsalgia, unspecified: Secondary | ICD-10-CM | POA: Diagnosis not present

## 2016-09-18 DIAGNOSIS — O99891 Other specified diseases and conditions complicating pregnancy: Secondary | ICD-10-CM

## 2016-09-18 DIAGNOSIS — Z3A29 29 weeks gestation of pregnancy: Secondary | ICD-10-CM | POA: Diagnosis not present

## 2016-09-18 DIAGNOSIS — Z3689 Encounter for other specified antenatal screening: Secondary | ICD-10-CM

## 2016-09-18 LAB — CBC
HEMATOCRIT: 30.9 % — AB (ref 36.0–46.0)
HEMOGLOBIN: 10.7 g/dL — AB (ref 12.0–15.0)
MCH: 32.2 pg (ref 26.0–34.0)
MCHC: 34.6 g/dL (ref 30.0–36.0)
MCV: 93.1 fL (ref 78.0–100.0)
Platelets: 176 10*3/uL (ref 150–400)
RBC: 3.32 MIL/uL — AB (ref 3.87–5.11)
RDW: 13.2 % (ref 11.5–15.5)
WBC: 11.3 10*3/uL — AB (ref 4.0–10.5)

## 2016-09-18 LAB — URINALYSIS, ROUTINE W REFLEX MICROSCOPIC
Bacteria, UA: NONE SEEN
Bilirubin Urine: NEGATIVE
Glucose, UA: 50 mg/dL — AB
Ketones, ur: NEGATIVE mg/dL
Leukocytes, UA: NEGATIVE
NITRITE: NEGATIVE
Protein, ur: 30 mg/dL — AB
SPECIFIC GRAVITY, URINE: 1.016 (ref 1.005–1.030)
pH: 7 (ref 5.0–8.0)

## 2016-09-18 LAB — WET PREP, GENITAL
CLUE CELLS WET PREP: NONE SEEN
SPERM: NONE SEEN
Trich, Wet Prep: NONE SEEN
Yeast Wet Prep HPF POC: NONE SEEN

## 2016-09-18 MED ORDER — NIFEDIPINE 10 MG PO CAPS
10.0000 mg | ORAL_CAPSULE | Freq: Once | ORAL | Status: AC
  Administered 2016-09-18: 10 mg via ORAL
  Filled 2016-09-18: qty 1

## 2016-09-18 MED ORDER — LACTATED RINGERS IV SOLN
INTRAVENOUS | Status: DC
  Administered 2016-09-18: 12:00:00 via INTRAVENOUS

## 2016-09-18 MED ORDER — ACETAMINOPHEN 500 MG PO TABS
1000.0000 mg | ORAL_TABLET | Freq: Four times a day (QID) | ORAL | Status: DC | PRN
  Administered 2016-09-18: 1000 mg via ORAL
  Filled 2016-09-18: qty 2

## 2016-09-18 NOTE — Discharge Instructions (Signed)
Back Pain in Pregnancy °Introduction °Back pain during pregnancy is common. Back pain may be caused by several factors that are related to changes during your pregnancy. °Follow these instructions at home: °Managing pain, stiffness, and swelling °· If directed, apply ice for sudden (acute) back pain. °¨ Put ice in a plastic bag. °¨ Place a towel between your skin and the bag. °¨ Leave the ice on for 20 minutes, 2-3 times per day. °· If directed, apply heat to the affected area before you exercise: °¨ Place a towel between your skin and the heat pack or heating pad. °¨ Leave the heat on for 20-30 minutes. °¨ Remove the heat if your skin turns bright red. This is especially important if you are unable to feel pain, heat, or cold. You may have a greater risk of getting burned. °Activity °· Exercise as told by your health care provider. Exercising is the best way to prevent or manage back pain. °· Listen to your body when lifting. If lifting hurts, ask for help or bend your knees. This uses your leg muscles instead of your back muscles. °· Squat down when picking up something from the floor. Do not bend over. °· Only use bed rest as told by your health care provider. Bed rest should only be used for the most severe episodes of back pain. °Standing, Sitting, and Lying Down °· Do not stand in one place for long periods of time. °· Use good posture when sitting. Make sure your head rests over your shoulders and is not hanging forward. Use a pillow on your lower back if necessary. °· Try sleeping on your side, preferably the left side, with a pillow or two between your legs. If you are sore after a night's rest, your bed may be too soft. A firm mattress may provide more support for your back during pregnancy. °General instructions °· Do not wear high heels. °· Eat a healthy diet. Try to gain weight within your health care provider's recommendations. °· Use a maternity girdle, elastic sling, or back brace as told by your  health care provider. °· Take over-the-counter and prescription medicines only as told by your health care provider. °· Keep all follow-up visits as told by your health care provider. This is important. This includes any visits with any specialists, such as a physical therapist. °Contact a health care provider if: °· Your back pain interferes with your daily activities. °· You have increasing pain in other parts of your body. °Get help right away if: °· You develop numbness, tingling, weakness, or problems with the use of your arms or legs. °· You develop severe back pain that is not controlled with medicine. °· You have a sudden change in bowel or bladder control. °· You develop shortness of breath, dizziness, or you faint. °· You develop nausea, vomiting, or sweating. °· You have back pain that is a rhythmic, cramping pain similar to labor pains. Labor pain is usually 1-2 minutes apart, lasts for about 1 minute, and involves a bearing down feeling or pressure in your pelvis. °· You have back pain and your water breaks or you have vaginal bleeding. °· You have back pain or numbness that travels down your leg. °· Your back pain developed after you fell. °· You develop pain on one side of your back. °· You see blood in your urine. °· You develop skin blisters in the area of your back pain. °This information is not intended to replace advice given to   you by your health care provider. Make sure you discuss any questions you have with your health care provider. °Document Released: 11/25/2005 Document Revised: 01/23/2016 Document Reviewed: 05/01/2015 °© 2017 Elsevier °Braxton Hicks Contractions °Contractions of the uterus can occur throughout pregnancy. Contractions are not always a sign that you are in labor.  °WHAT ARE BRAXTON HICKS CONTRACTIONS?  °Contractions that occur before labor are called Braxton Hicks contractions, or false labor. Toward the end of pregnancy (32-34 weeks), these contractions can develop more  often and may become more forceful. This is not true labor because these contractions do not result in opening (dilatation) and thinning of the cervix. They are sometimes difficult to tell apart from true labor because these contractions can be forceful and people have different pain tolerances. You should not feel embarrassed if you go to the hospital with false labor. Sometimes, the only way to tell if you are in true labor is for your health care provider to look for changes in the cervix. °If there are no prenatal problems or other health problems associated with the pregnancy, it is completely safe to be sent home with false labor and await the onset of true labor. °HOW CAN YOU TELL THE DIFFERENCE BETWEEN TRUE AND FALSE LABOR? °False Labor  °· The contractions of false labor are usually shorter and not as hard as those of true labor.   °· The contractions are usually irregular.   °· The contractions are often felt in the front of the lower abdomen and in the groin.   °· The contractions may go away when you walk around or change positions while lying down.   °· The contractions get weaker and are shorter lasting as time goes on.   °· The contractions do not usually become progressively stronger, regular, and closer together as with true labor.   °True Labor  °· Contractions in true labor last 30-70 seconds, become very regular, usually become more intense, and increase in frequency.   °· The contractions do not go away with walking.   °· The discomfort is usually felt in the top of the uterus and spreads to the lower abdomen and low back.   °· True labor can be determined by your health care provider with an exam. This will show that the cervix is dilating and getting thinner.   °WHAT TO REMEMBER °· Keep up with your usual exercises and follow other instructions given by your health care provider.   °· Take medicines as directed by your health care provider.   °· Keep your regular prenatal appointments.    °· Eat and drink lightly if you think you are going into labor.   °· If Braxton Hicks contractions are making you uncomfortable:   °¨ Change your position from lying down or resting to walking, or from walking to resting.   °¨ Sit and rest in a tub of warm water.   °¨ Drink 2-3 glasses of water. Dehydration may cause these contractions.   °¨ Do slow and deep breathing several times an hour.   °WHEN SHOULD I SEEK IMMEDIATE MEDICAL CARE? °Seek immediate medical care if: °· Your contractions become stronger, more regular, and closer together.   °· You have fluid leaking or gushing from your vagina.   °· You have a fever.   °· You pass blood-tinged mucus.   °· You have vaginal bleeding.   °· You have continuous abdominal pain.   °· You have low back pain that you never had before.   °· You feel your baby's head pushing down and causing pelvic pressure.   °· Your baby is not moving as much as   it used to.   °This information is not intended to replace advice given to you by your health care provider. Make sure you discuss any questions you have with your health care provider. °Document Released: 08/17/2005 Document Revised: 12/09/2015 Document Reviewed: 05/29/2013 °Elsevier Interactive Patient Education © 2017 Elsevier Inc. ° °

## 2016-09-18 NOTE — MAU Note (Signed)
Patient presents with lower abdominal pain which she was seen for one week ago.  Stating pain is worse. Denies vaginal bleeding.

## 2016-09-18 NOTE — MAU Provider Note (Signed)
History     CSN: [id number]  Arrival date and time: 09/18/16 1122   First Provider Initiated Contact with Patient 09/18/16 1208      Chief Complaint  Patient presents with  . Abdominal Pain   G3P0111 @29 .2 weeks here with intermittent lower abdominal and back pain. She describes the pain as sharp and occurring q6 minutes since 2am. She has not taken anything for the pain. She denies aggravating or alleviating factors. She reports good FM. She denies VB and LOF. Denies recent IC. Review of records reveals pregnancy has been complicated by hx of previous CS at 36 weeks for placenta previa.    OB History    Gravida Para Term Preterm AB Living   3 1   1 1 1    SAB TAB Ectopic Multiple Live Births   1       1      History reviewed. No pertinent past medical history.  Past Surgical History:  Procedure Laterality Date  . CESAREAN SECTION N/A 05/24/2014   Procedure: CESAREAN SECTION;  Surgeon: Konrad Felix, MD;  Location: [facility];  Service: Obstetrics;  Laterality: N/A;  . WISDOM TOOTH EXTRACTION      Family History  Problem Relation Age of Onset  . Alcohol abuse Neg Hx   . Arthritis Neg Hx   . Asthma Neg Hx   . Birth defects Neg Hx   . Cancer Neg Hx   . COPD Neg Hx   . Depression Neg Hx   . Diabetes Neg Hx   . Drug abuse Neg Hx   . Early death Neg Hx   . Hearing loss Neg Hx   . Heart disease Neg Hx   . Hyperlipidemia Neg Hx   . Kidney disease Neg Hx   . Hypertension Neg Hx   . Learning disabilities Neg Hx   . Mental illness Neg Hx   . Mental retardation Neg Hx   . Miscarriages / Stillbirths Neg Hx   . Stroke Neg Hx   . Vision loss Neg Hx   . Varicose Veins Neg Hx     Social History  Substance Use Topics  . Smoking status: Never Smoker  . Smokeless tobacco: Never Used  . Alcohol use No    Allergies: No Known Allergies  Prescriptions Prior to Admission  Medication Sig Dispense Refill Last Dose  . acetaminophen (TYLENOL) 500 MG tablet Take 1,000 mg by  mouth every 6 (six) hours as needed for moderate pain.    09/17/2016 at Unknown time  . Prenatal Vit-Fe Fumarate-FA (PRENATAL MULTIVITAMIN) TABS tablet Take 1 tablet by mouth daily at 12 noon.   09/17/2016 at Unknown time    Review of Systems  Constitutional: Negative for chills and fever.  Gastrointestinal: Positive for abdominal pain and nausea. Negative for vomiting.  Genitourinary: Negative for dysuria, urgency, vaginal bleeding and vaginal discharge.   Physical Exam   Blood pressure 104/62, pulse 98, temperature 98.3 F (36.8 C), resp. rate 18, unknown if currently breastfeeding.  Physical Exam  Nursing note and vitals reviewed. Constitutional: She is oriented to person, place, and time. She appears well-developed and well-nourished. No distress.  HENT:  Head: Normocephalic.  Neck: Normal range of motion.  Cardiovascular: Normal rate.   Respiratory: Effort normal.  GI: Soft. She exhibits no distension. There is no tenderness.  gravid  Genitourinary:  Genitourinary Comments: SVE: closed/thick  Musculoskeletal: Normal range of motion.  Neurological: She is alert and oriented to person, place, and time.  Skin: Skin is warm and dry.  Psychiatric: She has a normal mood and affect.  EFM: 150 bpm, mod variability, + accels, no decels Toco: none  Results for orders placed or performed during the hospital encounter of 09/18/16 (from the past 24 hour(s))  Urinalysis, Routine w reflex microscopic     Status: Abnormal   Collection Time: 09/18/16 11:55 AM  Result Value Ref Range   Color, Urine YELLOW YELLOW   APPearance HAZY (A) CLEAR   Specific Gravity, Urine 1.016 1.005 - 1.030   pH 7.0 5.0 - 8.0   Glucose, UA 50 (A) NEGATIVE mg/dL   Hgb urine dipstick SMALL (A) NEGATIVE   Bilirubin Urine NEGATIVE NEGATIVE   Ketones, ur NEGATIVE NEGATIVE mg/dL   Protein, ur 30 (A) NEGATIVE mg/dL   Nitrite NEGATIVE NEGATIVE   Leukocytes, UA NEGATIVE NEGATIVE   RBC / HPF 6-30 0 - 5 RBC/hpf    WBC, UA 0-5 0 - 5 WBC/hpf   Bacteria, UA NONE SEEN NONE SEEN   Squamous Epithelial / LPF 0-5 (A) NONE SEEN   Mucous PRESENT   CBC     Status: Abnormal   Collection Time: 09/18/16 12:15 PM  Result Value Ref Range   WBC 11.3 (H) 4.0 - 10.5 K/uL   RBC 3.32 (L) 3.87 - 5.11 MIL/uL   Hemoglobin 10.7 (L) 12.0 - 15.0 g/dL   HCT 09.830.9 (L) 11.936.0 - 14.746.0 %   MCV 93.1 78.0 - 100.0 fL   MCH 32.2 26.0 - 34.0 pg   MCHC 34.6 30.0 - 36.0 g/dL   RDW 82.913.2 56.211.5 - 13.015.5 %   Platelets 176 150 - 400 K/uL  Wet prep, genital     Status: Abnormal   Collection Time: 09/18/16 12:21 PM  Result Value Ref Range   Yeast Wet Prep HPF POC NONE SEEN NONE SEEN   Trich, Wet Prep NONE SEEN NONE SEEN   Clue Cells Wet Prep HPF POC NONE SEEN NONE SEEN   WBC, Wet Prep HPF POC MODERATE (A) NONE SEEN   Sperm NONE SEEN    MAU Course  Procedures LR 1 L bolus Tylenol 1g po Procardia 10 mg po  MDM Labs ordered and reviewed. No evidence of UTI, vaginal infection, or PTL. Presentation, clinical findings, and plan discussed with Dr. Richardson Doppole. No evidence of ctx on toco or by palpation but will try Procardia. Pt reports resolution of pain after Tylenol and Procardia. Stable for discharge home.  Assessment and Plan   1. [redacted] weeks gestation of pregnancy   2. NST (non-stress test) reactive   3. Abdominal pain affecting pregnancy   4. Back pain affecting pregnancy in third trimester    Discharge home Follow [facility] at [facility] as scheduled  Return for worsening sx  Allergies as of 09/18/2016   No Known Allergies     Medication List    TAKE these medications   acetaminophen 500 MG tablet Commonly known as:  TYLENOL Take 1,000 mg by mouth every 6 (six) hours as needed for moderate pain.   prenatal multivitamin Tabs tablet Take 1 tablet by mouth daily at 12 noon.      Megan Johnson, CNM 09/18/2016, 12:09 PM

## 2016-09-21 LAB — GC/CHLAMYDIA PROBE AMP (~~LOC~~) NOT AT ARMC
Chlamydia: NEGATIVE
Neisseria Gonorrhea: NEGATIVE

## 2016-09-28 ENCOUNTER — Encounter (HOSPITAL_COMMUNITY): Payer: Self-pay

## 2016-09-28 ENCOUNTER — Inpatient Hospital Stay (HOSPITAL_COMMUNITY)
Admission: AD | Admit: 2016-09-28 | Discharge: 2016-09-28 | Disposition: A | Discharge status: Home / Self Care | Payer: 59 | Source: Ambulatory Visit | Attending: Obstetrics and Gynecology | Admitting: Obstetrics and Gynecology

## 2016-09-28 DIAGNOSIS — L905 Scar conditions and fibrosis of skin: Secondary | ICD-10-CM

## 2016-09-28 DIAGNOSIS — O26893 Other specified pregnancy related conditions, third trimester: Secondary | ICD-10-CM | POA: Insufficient documentation

## 2016-09-28 DIAGNOSIS — K5901 Slow transit constipation: Secondary | ICD-10-CM

## 2016-09-28 DIAGNOSIS — R109 Unspecified abdominal pain: Secondary | ICD-10-CM

## 2016-09-28 DIAGNOSIS — R52 Pain, unspecified: Secondary | ICD-10-CM

## 2016-09-28 DIAGNOSIS — Z3A30 30 weeks gestation of pregnancy: Secondary | ICD-10-CM

## 2016-09-28 DIAGNOSIS — O34211 Maternal care for low transverse scar from previous cesarean delivery: Secondary | ICD-10-CM | POA: Diagnosis not present

## 2016-09-28 DIAGNOSIS — K5900 Constipation, unspecified: Secondary | ICD-10-CM | POA: Insufficient documentation

## 2016-09-28 DIAGNOSIS — R1030 Lower abdominal pain, unspecified: Secondary | ICD-10-CM | POA: Insufficient documentation

## 2016-09-28 DIAGNOSIS — O9989 Other specified diseases and conditions complicating pregnancy, childbirth and the puerperium: Secondary | ICD-10-CM | POA: Diagnosis not present

## 2016-09-28 LAB — URINALYSIS, ROUTINE W REFLEX MICROSCOPIC
BILIRUBIN URINE: NEGATIVE
Glucose, UA: 50 mg/dL — AB
KETONES UR: NEGATIVE mg/dL
LEUKOCYTES UA: NEGATIVE
NITRITE: NEGATIVE
PROTEIN: NEGATIVE mg/dL
Specific Gravity, Urine: 1.01 (ref 1.005–1.030)
pH: 8 (ref 5.0–8.0)

## 2016-09-28 MED ORDER — POLYETHYLENE GLYCOL 3350 17 G PO PACK: 17.0000 g | PACK | Freq: Every day | ORAL | 6 refills | Status: DC

## 2016-09-28 NOTE — MAU Note (Signed)
Pt presents to MAU with complaints of pain that comes and goes near her cesarean section scar. Reports pain for two weeks, Pt has been evaluated and states that she is told she is not having contractions. Pt denies any vaginal bleeding or abnormal discharge

## 2016-09-28 NOTE — MAU Provider Note (Signed)
History     CSN: [id number]  Arrival date and time: 09/28/16 1127   First Provider Initiated Contact with Patient 09/28/16 1225      She is a 31 year old G3 P1 at 30 weeks and 5 days who presents with concerns for lower abdominal pain. She does have a history of previous C-section. She reports the pain is mostly around the C-section scar or in the suprapubic area. It comes and goes daily. She is not currently in any pain and currently reports her pain as 0 out of 10. She does report some occasional tightening in her belly. Approximally once per hour. She is drinking 4-5 16 ounce bottles of water a day. She denies any vaginal bleeding she reports regular fetal movement. She denies any significant vaginal discharge.    OB History    Gravida Para Term Preterm AB Living   3 1   1 1 1    SAB TAB Ectopic Multiple Live Births   1       1      History reviewed. No pertinent past medical history.  Past Surgical History:  Procedure Laterality Date  . CESAREAN SECTION N/A 05/24/2014   Procedure: CESAREAN SECTION;  Surgeon: Konrad FelixEma Wakuru Kulwa, MD;  Location: [facility];  Service: Obstetrics;  Laterality: N/A;  . WISDOM TOOTH EXTRACTION      Family History  Problem Relation Age of Onset  . Alcohol abuse Neg Hx   . Arthritis Neg Hx   . Asthma Neg Hx   . Birth defects Neg Hx   . Cancer Neg Hx   . COPD Neg Hx   . Depression Neg Hx   . Diabetes Neg Hx   . Drug abuse Neg Hx   . Early death Neg Hx   . Hearing loss Neg Hx   . Heart disease Neg Hx   . Hyperlipidemia Neg Hx   . Kidney disease Neg Hx   . Hypertension Neg Hx   . Learning disabilities Neg Hx   . Mental illness Neg Hx   . Mental retardation Neg Hx   . Miscarriages / Stillbirths Neg Hx   . Stroke Neg Hx   . Vision loss Neg Hx   . Varicose Veins Neg Hx     Social History  Substance Use Topics  . Smoking status: Never Smoker  . Smokeless tobacco: Never Used  . Alcohol use No    Allergies: No Known  Allergies  Prescriptions Prior to Admission  Medication Sig Dispense Refill Last Dose  . acetaminophen (TYLENOL) 500 MG tablet Take 1,000 mg by mouth every 6 (six) hours as needed for moderate pain.    09/17/2016 at Unknown time  . Prenatal Vit-Fe Fumarate-FA (PRENATAL MULTIVITAMIN) TABS tablet Take 1 tablet by mouth daily at 12 noon.   09/17/2016 at Unknown time    Review of Systems  Constitutional: Negative for chills and fever.  HENT: Negative for congestion and rhinorrhea.   Respiratory: Negative for chest tightness and shortness of breath.   Cardiovascular: Negative for chest pain and palpitations.  Gastrointestinal: Positive for abdominal pain. Negative for abdominal distention, constipation, diarrhea and nausea.  Genitourinary: Negative for difficulty urinating, dysuria, flank pain and frequency.  Neurological: Negative for dizziness.   Physical Exam   Blood pressure (!) 102/54, pulse 95, temperature 98.1 F (36.7 C), resp. rate 18, height 5' (1.524 m), weight 154 lb (69.9 kg), SpO2 96 %, unknown if currently breastfeeding.  Physical Exam  Constitutional: She appears well-developed and well-nourished.  Cardiovascular: Normal rate and intact distal pulses.   Respiratory: Effort normal. No respiratory distress.  GI: Soft. Bowel sounds are normal. She exhibits no distension. There is no tenderness. There is no rebound and no guarding.  Genitourinary:  Genitourinary Comments: Cervix closed thick and high, no significant cervical motion tenderness, significant stool palpable through the posterior aspect of the vagina    MAU Course  Procedures  MDM In MA U patient underwent monitoring revealed a strip appropriate for gestational age of 10 x 10 accelerations and baseline heart rate around 150. No contractions were picked up on the monitor.  Urinalysis was performed and negative.  Assessment and Plan  #1: Constipation recommended MiraLAX 1 capful daily for 3-4 days then when  necessary. Patient voiced understanding 2: Intermittent abdominal pain may be secondary to C-section scar tissue versus Braxton Hicks contractions. No cervical change at this time. Okay for discharge home.  Ernestina Penna 09/28/2016, 12:28 PM

## 2016-09-28 NOTE — Discharge Instructions (Signed)
Constipation, Adult Constipation is when a person:  Poops (has a bowel movement) fewer times in a week than normal.  Has a hard time pooping.  Has poop that is dry, hard, or bigger than normal. Follow these instructions at home: Eating and drinking  Eat foods that have a lot of fiber, such as:  Fresh fruits and vegetables.  Whole grains.  Beans.  Eat less of foods that are high in fat, low in fiber, or overly processed, such as:  [place] fries.  Hamburgers.  Cookies.  Candy.  Soda.  Drink enough fluid to keep your pee (urine) clear or pale yellow. General instructions  Exercise regularly or as told by your doctor.  Go to the restroom when you feel like you need to poop. Do not hold it in.  Take over-the-counter and prescription medicines only as told by your doctor. These include any fiber supplements.  Do pelvic floor retraining exercises, such as:  Doing deep breathing while relaxing your lower belly (abdomen).  Relaxing your pelvic floor while pooping.  Watch your condition for any changes.  Keep all follow-up visits as told by your doctor. This is important. Contact a doctor if:  You have pain that gets worse.  You have a fever.  You have not pooped for 4 days.  You throw up (vomit).  You are not hungry.  You lose weight.  You are bleeding from the anus.  You have thin, pencil-like poop (stool). Get help right away if:  You have a fever, and your symptoms suddenly get worse.  You leak poop or have blood in your poop.  Your belly feels hard or bigger than normal (is bloated).  You have very bad belly pain.  You feel dizzy or you faint. This information is not intended to replace advice given to you by your health care provider. Make sure you discuss any questions you have with your health care provider. Document Released: 02/03/2008 Document Revised: 03/06/2016 Document Reviewed: 02/05/2016 Elsevier Interactive Patient Education  2017  Elsevier Inc.  Ball CorporationBraxton Hicks Contractions Contractions of the uterus can occur throughout pregnancy. Contractions are not always a sign that you are in labor.  WHAT ARE BRAXTON HICKS CONTRACTIONS?  Contractions that occur before labor are called Braxton Hicks contractions, or false labor. Toward the end of pregnancy (32-34 weeks), these contractions can develop more often and may become more forceful. This is not true labor because these contractions do not result in opening (dilatation) and thinning of the cervix. They are sometimes difficult to tell apart from true labor because these contractions can be forceful and people have different pain tolerances. You should not feel embarrassed if you go to the hospital with false labor. Sometimes, the only way to tell if you are in true labor is for your health care provider to look for changes in the cervix. If there are no prenatal problems or other health problems associated with the pregnancy, it is completely safe to be sent home with false labor and await the onset of true labor. HOW CAN YOU TELL THE DIFFERENCE BETWEEN TRUE AND FALSE LABOR? False Labor   The contractions of false labor are usually shorter and not as hard as those of true labor.   The contractions are usually irregular.   The contractions are often felt in the front of the lower abdomen and in the groin.   The contractions may go away when you walk around or change positions while lying down.   The contractions  get weaker and are shorter lasting as time goes on.   The contractions do not usually become progressively stronger, regular, and closer together as with true labor.  True Labor   Contractions in true labor last 30-70 seconds, become very regular, usually become more intense, and increase in frequency.   The contractions do not go away with walking.   The discomfort is usually felt in the top of the uterus and spreads to the lower abdomen and low back.    True labor can be determined by your health care provider with an exam. This will show that the cervix is dilating and getting thinner.  WHAT TO REMEMBER  Keep up with your usual exercises and follow other instructions given by your health care provider.   Take medicines as directed by your health care provider.   Keep your regular prenatal appointments.   Eat and drink lightly if you think you are going into labor.   If Braxton Hicks contractions are making you uncomfortable:   Change your position from lying down or resting to walking, or from walking to resting.   Sit and rest in a tub of warm water.   Drink 2-3 glasses of water. Dehydration may cause these contractions.   Do slow and deep breathing several times an hour.  WHEN SHOULD I SEEK IMMEDIATE MEDICAL CARE? Seek immediate medical care if:  Your contractions become stronger, more regular, and closer together.   You have fluid leaking or gushing from your vagina.   You have a fever.   You pass blood-tinged mucus.   You have vaginal bleeding.   You have continuous abdominal pain.   You have low back pain that you never had before.   You feel your baby's head pushing down and causing pelvic pressure.   Your baby is not moving as much as it used to.  This information is not intended to replace advice given to you by your health care provider. Make sure you discuss any questions you have with your health care provider. Document Released: 08/17/2005 Document Revised: 12/09/2015 Document Reviewed: 05/29/2013 Elsevier Interactive Patient Education  2017 [employer].

## 2016-10-07 ENCOUNTER — Inpatient Hospital Stay (HOSPITAL_COMMUNITY)
Admission: AD | Admit: 2016-10-07 | Discharge: 2016-10-08 | Disposition: A | Discharge status: Home / Self Care | Payer: 59 | Source: Ambulatory Visit | Attending: Obstetrics and Gynecology | Admitting: Obstetrics and Gynecology

## 2016-10-07 ENCOUNTER — Encounter (HOSPITAL_COMMUNITY): Payer: Self-pay

## 2016-10-07 DIAGNOSIS — R1010 Upper abdominal pain, unspecified: Secondary | ICD-10-CM | POA: Diagnosis not present

## 2016-10-07 DIAGNOSIS — O26893 Other specified pregnancy related conditions, third trimester: Secondary | ICD-10-CM | POA: Insufficient documentation

## 2016-10-07 DIAGNOSIS — R109 Unspecified abdominal pain: Secondary | ICD-10-CM | POA: Diagnosis present

## 2016-10-07 DIAGNOSIS — O99613 Diseases of the digestive system complicating pregnancy, third trimester: Secondary | ICD-10-CM | POA: Diagnosis not present

## 2016-10-07 DIAGNOSIS — K219 Gastro-esophageal reflux disease without esophagitis: Secondary | ICD-10-CM | POA: Diagnosis not present

## 2016-10-07 DIAGNOSIS — Z3A32 32 weeks gestation of pregnancy: Secondary | ICD-10-CM

## 2016-10-07 LAB — URINALYSIS, ROUTINE W REFLEX MICROSCOPIC
Bilirubin Urine: NEGATIVE
Glucose, UA: 50 mg/dL — AB
Ketones, ur: NEGATIVE mg/dL
Nitrite: NEGATIVE
PROTEIN: NEGATIVE mg/dL
Specific Gravity, Urine: 1.006 (ref 1.005–1.030)
pH: 8 (ref 5.0–8.0)

## 2016-10-07 LAB — COMPREHENSIVE METABOLIC PANEL
ALK PHOS: 73 U/L (ref 38–126)
ALT: 12 U/L — AB (ref 14–54)
AST: 21 U/L (ref 15–41)
Albumin: 3.2 g/dL — ABNORMAL LOW (ref 3.5–5.0)
Anion gap: 7 (ref 5–15)
BUN: 6 mg/dL (ref 6–20)
CALCIUM: 8.5 mg/dL — AB (ref 8.9–10.3)
CO2: 23 mmol/L (ref 22–32)
Chloride: 101 mmol/L (ref 101–111)
Creatinine, Ser: 0.35 mg/dL — ABNORMAL LOW (ref 0.44–1.00)
GFR calc Af Amer: >60 mL/min (ref >60)
GFR calc non Af Amer: >60 mL/min (ref >60)
GLUCOSE: 99 mg/dL (ref 65–99)
Potassium: 3.4 mmol/L — ABNORMAL LOW (ref 3.5–5.1)
Sodium: 131 mmol/L — ABNORMAL LOW (ref 135–145)
TOTAL PROTEIN: 6.7 g/dL (ref 6.5–8.1)
Total Bilirubin: 0.4 mg/dL (ref 0.3–1.2)

## 2016-10-07 LAB — CBC
HEMATOCRIT: 28.9 % — AB (ref 36.0–46.0)
HEMOGLOBIN: 10.2 g/dL — AB (ref 12.0–15.0)
MCH: 32.5 pg (ref 26.0–34.0)
MCHC: 35.3 g/dL (ref 30.0–36.0)
MCV: 92 fL (ref 78.0–100.0)
Platelets: 136 10*3/uL — ABNORMAL LOW (ref 150–400)
RBC: 3.14 MIL/uL — ABNORMAL LOW (ref 3.87–5.11)
RDW: 13 % (ref 11.5–15.5)
WBC: 11.3 10*3/uL — AB (ref 4.0–10.5)

## 2016-10-07 LAB — AMYLASE: Amylase: 114 U/L — ABNORMAL HIGH (ref 28–100)

## 2016-10-07 LAB — LIPASE, BLOOD: Lipase: 19 U/L (ref 11–51)

## 2016-10-07 MED ORDER — PANTOPRAZOLE SODIUM 20 MG PO TBEC
20.0000 mg | DELAYED_RELEASE_TABLET | Freq: Once | ORAL | Status: AC
  Administered 2016-10-07: 20 mg via ORAL
  Filled 2016-10-07: qty 1

## 2016-10-07 MED ORDER — FAMOTIDINE 20 MG PO TABS
40.0000 mg | ORAL_TABLET | Freq: Once | ORAL | Status: AC
  Administered 2016-10-07: 40 mg via ORAL
  Filled 2016-10-07: qty 2

## 2016-10-07 MED ORDER — CALCIUM CARBONATE ANTACID 500 MG PO CHEW
800.0000 mg | CHEWABLE_TABLET | Freq: Once | ORAL | Status: AC
  Administered 2016-10-07: 800 mg via ORAL
  Filled 2016-10-07: qty 4

## 2016-10-07 MED ORDER — GI COCKTAIL ~~LOC~~
30.0000 mL | Freq: Once | ORAL | Status: AC
  Administered 2016-10-07: 30 mL via ORAL
  Filled 2016-10-07: qty 30

## 2016-10-07 NOTE — MAU Note (Signed)
Pt presents to MAU with upper abdominal pain. Reports pain started today around 1600. Took tylenol for pain with no relief. Pt reports good fetal movement. Denies bleeding or leaking of fluid.

## 2016-10-07 NOTE — MAU Provider Note (Signed)
Chief Complaint:  Abdominal Pain   First Provider Initiated Contact with Patient 10/07/16 2115      HPI: Megan Johnson is a 31 y.o. [phone] at [id number] presents to maternity admissions reporting sudden onset of upper abdominal pain today at 4 pm. This is a new symptom.  It is constant, sharp pain that is unchanged in intensity since onset.  She tried Tylenol for pain but it did not help. She reports eating ~2 hours prior to onset of this pain. She has never had this pain before.  There are no associated symptoms. She reports good fetal movement, denies regular contractions, LOF, vaginal bleeding, vaginal itching/burning, urinary symptoms, h/a, dizziness, n/v, or fever/chills.    HPI  Past Medical History: History reviewed. No pertinent past medical history.  Past obstetric history: OB History  Gravida Para Term Preterm AB Living  3 1   1 1 1   SAB TAB Ectopic Multiple Live Births  1       1    # Outcome Date GA Lbr Len/2nd Weight Sex Delivery Anes PTL Lv  3 Current           2 Preterm 05/24/14 [id number]  6 lb 5.2 oz (2.87 kg) F CS-LTranv Spinal, EPI  LIV  1 SAB               Past Surgical History: Past Surgical History:  Procedure Laterality Date  . CESAREAN SECTION N/A 05/24/2014   Procedure: CESAREAN SECTION;  Surgeon: Konrad FelixEma Wakuru Kulwa, MD;  Location: [facility];  Service: Obstetrics;  Laterality: N/A;  . WISDOM TOOTH EXTRACTION      Family History: Family History  Problem Relation Age of Onset  . Alcohol abuse Neg Hx   . Arthritis Neg Hx   . Asthma Neg Hx   . Birth defects Neg Hx   . Cancer Neg Hx   . COPD Neg Hx   . Depression Neg Hx   . Diabetes Neg Hx   . Drug abuse Neg Hx   . Early death Neg Hx   . Hearing loss Neg Hx   . Heart disease Neg Hx   . Hyperlipidemia Neg Hx   . Kidney disease Neg Hx   . Hypertension Neg Hx   . Learning disabilities Neg Hx   . Mental illness Neg Hx   . Mental retardation Neg Hx   . Miscarriages / Stillbirths Neg Hx   . Stroke  Neg Hx   . Vision loss Neg Hx   . Varicose Veins Neg Hx     Social History: Social History  Substance Use Topics  . Smoking status: Never Smoker  . Smokeless tobacco: Never Used  . Alcohol use No    Allergies: No Known Allergies  Meds:  No prescriptions prior to admission.    ROS:  Review of Systems  Constitutional: Negative for chills, fatigue and fever.  Eyes: Negative for visual disturbance.  Respiratory: Negative for shortness of breath.   Cardiovascular: Negative for chest pain.  Gastrointestinal: Positive for abdominal pain. Negative for nausea and vomiting.  Genitourinary: Negative for difficulty urinating, dysuria, flank pain, pelvic pain, vaginal bleeding, vaginal discharge and vaginal pain.  Neurological: Negative for dizziness and headaches.  Psychiatric/Behavioral: Negative.      I have reviewed patient's Past Medical Hx, Surgical Hx, Family Hx, Social Hx, medications and allergies.   Physical Exam  No data found.  10/08/16 0302 -- 111 18 103/57 -- [facility]  10/08/16 0247 --  105 --  102/54 -- -- [facility]  10/08/16 0232 -- 112 -- 108/60 -- -- [facility]  10/08/16 0217 -- 107 -- 106/59 -- -- [facility]  10/08/16 0205 -- 109 --  99/53 -- -- [facility]  16/10/96 0454 -- 098 18 111/61 -- -- [facility]  10/07/16 2043 98.5 F (36.9 C) 119 18 100/58 -- -- [facility]    Constitutional: Well-developed, well-nourished female in no acute distress.  Cardiovascular: normal rate Respiratory: normal effort GI: Abd soft, tenderness in epigastric area only, gravid appropriate for gestational age.  MS: Extremities nontender, no edema, normal ROM Neurologic: Alert and oriented x 4.  GU: Neg CVAT.  PELVIC EXAM: Deferred     FHT:  Baseline 150 , moderate variability, accelerations present, no decelerations Contractions: Rare, mild to palpation   Labs: No results found for this or any previous visit (from the past 24 hour(s)).   Results for orders placed or performed during the hospital encounter of  10/07/16 (from the past 168 hour(s))  Urine culture   Collection Time: 10/07/16  8:32 PM  Result Value Ref Range   Specimen Description URINE, CLEAN CATCH    Special Requests NONE    Culture      NO GROWTH Performed at [facility], [address] [place], [place] [address]    Report Status 10/09/2016 FINAL   Protein / creatinine ratio, urine   Collection Time: 10/07/16  8:32 PM  Result Value Ref Range   Creatinine, Urine 45.00 mg/dL   Total Protein, Urine 18 mg/dL   Protein Creatinine Ratio 0.40 (H) 0.00 - 0.15 mg/mg[Cre]  Urinalysis, Routine w reflex microscopic   Collection Time: 10/07/16  8:37 PM  Result Value Ref Range   Color, Urine YELLOW YELLOW   APPearance CLEAR CLEAR   Specific Gravity, Urine 1.006 1.005 - 1.030   pH 8.0 5.0 - 8.0   Glucose, UA 50 (A) NEGATIVE mg/dL   Hgb urine dipstick MODERATE (A) NEGATIVE   Bilirubin Urine NEGATIVE NEGATIVE   Ketones, ur NEGATIVE NEGATIVE mg/dL   Protein, ur NEGATIVE NEGATIVE mg/dL   Nitrite NEGATIVE NEGATIVE   Leukocytes, UA TRACE (A) NEGATIVE   RBC / HPF 6-30 0 - 5 RBC/hpf   WBC, UA 0-5 0 - 5 WBC/hpf   Bacteria, UA RARE (A) NONE SEEN   Squamous Epithelial / LPF 0-5 (A) NONE SEEN   Mucous PRESENT   CBC   Collection Time: 10/07/16  9:58 PM  Result Value Ref Range   WBC 11.3 (H) 4.0 - 10.5 K/uL   RBC 3.14 (L) 3.87 - 5.11 MIL/uL   Hemoglobin 10.2 (L) 12.0 - 15.0 g/dL   HCT 78.2 (L) 95.6 - 21.3 %   MCV 92.0 78.0 - 100.0 fL   MCH 32.5 26.0 - 34.0 pg   MCHC 35.3 30.0 - 36.0 g/dL   RDW 08.6 57.8 - 46.9 %   Platelets 136 (L) 150 - 400 K/uL  Comprehensive metabolic panel   Collection Time: 10/07/16  9:58 PM  Result Value Ref Range   Sodium 131 (L) 135 - 145 mmol/L   Potassium 3.4 (L) 3.5 - 5.1 mmol/L   Chloride 101 101 - 111 mmol/L   CO2 23 22 - 32 mmol/L   Glucose, Bld 99 65 - 99 mg/dL   BUN 6 6 - 20 mg/dL   Creatinine, Ser 6.29 (L) 0.44 - 1.00 mg/dL   Calcium 8.5 (L) 8.9 - 10.3 mg/dL   Total Protein 6.7 6.5 -  8.1 g/dL  Albumin 3.2 (L) 3.5 - 5.0 g/dL   AST 21 15 - 41 U/L   ALT 12 (L) 14 - 54 U/L   Alkaline Phosphatase 73 38 - 126 U/L   Total Bilirubin 0.4 0.3 - 1.2 mg/dL   GFR calc non Af Amer >60 >60 mL/min   GFR calc Af Amer >60 >60 mL/min   Anion gap 7 5 - 15  Lipase, blood   Collection Time: 10/07/16  9:58 PM  Result Value Ref Range   Lipase 19 11 - 51 U/L  Amylase   Collection Time: 10/07/16  9:58 PM  Result Value Ref Range   Amylase 114 (H) 28 - 100 U/L    Imaging:  No results found.  MAU Course/MDM: I have ordered labs and reviewed results.  NST reviewed GI cocktail and Pepcid 40 mg PO given in MAU with some improvement in symptoms   Report to Thressa Sheller, CNM  Sharen Counter Certified Nurse-Midwife 10/09/2016 7:15 PM

## 2016-10-08 DIAGNOSIS — R1010 Upper abdominal pain, unspecified: Secondary | ICD-10-CM

## 2016-10-08 DIAGNOSIS — O26893 Other specified pregnancy related conditions, third trimester: Secondary | ICD-10-CM

## 2016-10-08 DIAGNOSIS — Z3A32 32 weeks gestation of pregnancy: Secondary | ICD-10-CM | POA: Diagnosis not present

## 2016-10-08 LAB — PROTEIN / CREATININE RATIO, URINE
Creatinine, Urine: 45 mg/dL
Protein Creatinine Ratio: 0.4 mg/mg{Cre} — ABNORMAL HIGH (ref 0.00–0.15)
Total Protein, Urine: 18 mg/dL

## 2016-10-08 MED ORDER — PANTOPRAZOLE SODIUM 40 MG PO TBEC: 40.0000 mg | DELAYED_RELEASE_TABLET | Freq: Every day | ORAL | 1 refills | Status: DC

## 2016-10-08 MED ORDER — BETAMETHASONE SOD PHOS & ACET 6 (3-3) MG/ML IJ SUSP
12.0000 mg | Freq: Once | INTRAMUSCULAR | Status: AC
  Administered 2016-10-08: 12 mg via INTRAMUSCULAR
  Filled 2016-10-08: qty 2

## 2016-10-08 NOTE — MAU Provider Note (Signed)
Unable to edit prior note. Care assumed from [facility] Left[facility]ir[facility], CNM. See prior note for full HPI, and PE  Results for orders placed or performed during the hospital encounter of 10/07/16 (from the past 24 hour(s))  Protein / creatinine ratio, urine     Status: Abnormal   Collection Time: 10/07/16  8:32 PM  Result Value Ref Range   Creatinine, Urine 45.00 mg/dL   Total Protein, Urine 18 mg/dL   Protein Creatinine Ratio 0.40 (H) 0.00 - 0.15 mg/mg[Cre]  Urinalysis, Routine w reflex microscopic     Status: Abnormal   Collection Time: 10/07/16  8:37 PM  Result Value Ref Range   Color, Urine YELLOW YELLOW   APPearance CLEAR CLEAR   Specific Gravity, Urine 1.006 1.005 - 1.030   pH 8.0 5.0 - 8.0   Glucose, UA 50 (A) NEGATIVE mg/dL   Hgb urine dipstick MODERATE (A) NEGATIVE   Bilirubin Urine NEGATIVE NEGATIVE   Ketones, ur NEGATIVE NEGATIVE mg/dL   Protein, ur NEGATIVE NEGATIVE mg/dL   Nitrite NEGATIVE NEGATIVE   Leukocytes, UA TRACE (A) NEGATIVE   RBC / HPF 6-30 0 - 5 RBC/hpf   WBC, UA 0-5 0 - 5 WBC/hpf   Bacteria, UA RARE (A) NONE SEEN   Squamous Epithelial / LPF 0-5 (A) NONE SEEN   Mucous PRESENT   CBC     Status: Abnormal   Collection Time: 10/07/16  9:58 PM  Result Value Ref Range   WBC 11.3 (H) 4.0 - 10.5 K/uL   RBC 3.14 (L) 3.87 - 5.11 MIL/uL   Hemoglobin 10.2 (L) 12.0 - 15.0 g/dL   HCT 40.928.9 (L) 81.136.0 - 91.446.0 %   MCV 92.0 78.0 - 100.0 fL   MCH 32.5 26.0 - 34.0 pg   MCHC 35.3 30.0 - 36.0 g/dL   RDW 78.213.0 95.611.5 - 21.315.5 %   Platelets 136 (L) 150 - 400 K/uL  Comprehensive metabolic panel     Status: Abnormal   Collection Time: 10/07/16  9:58 PM  Result Value Ref Range   Sodium 131 (L) 135 - 145 mmol/L   Potassium 3.4 (L) 3.5 - 5.1 mmol/L   Chloride 101 101 - 111 mmol/L   CO2 23 22 - 32 mmol/L   Glucose, Bld 99 65 - 99 mg/dL   BUN 6 6 - 20 mg/dL   Creatinine, Ser 0.860.35 (L) 0.44 - 1.00 mg/dL   Calcium 8.5 (L) 8.9 - 10.3 mg/dL   Total Protein 6.7 6.5 - 8.1 g/dL   Albumin 3.2  (L) 3.5 - 5.0 g/dL   AST 21 15 - 41 U/L   ALT 12 (L) 14 - 54 U/L   Alkaline Phosphatase 73 38 - 126 U/L   Total Bilirubin 0.4 0.3 - 1.2 mg/dL   GFR calc non Af Amer >60 >60 mL/min   GFR calc Af Amer >60 >60 mL/min   Anion gap 7 5 - 15  Lipase, blood     Status: None   Collection Time: 10/07/16  9:58 PM  Result Value Ref Range   Lipase 19 11 - 51 U/L  Amylase     Status: Abnormal   Collection Time: 10/07/16  9:58 PM  Result Value Ref Range   Amylase 114 (H) 28 - 100 U/L   FHT: 130, moderate with 15x15 accels, no decels  Toco: rare UC  2333: D/W Dr. Molly Maduroobert, give protonix and tums here. Send urine for PC:R. If not better with tums/protonix call back. If better then she can  be DC home with FU in one week.   9604: Patient reports that her pain is better at this time.  0203: D/W Dr. Su Hilt results of PC:R. Will give BMZ here today. Will get serial B/Ps here, and give BMZ. If B/P remains 100s/60-70s, as they have been, we will dc home, and have her return in 24 hours for 2nd BMZ. FU in the office on Friday.   Assessment/plan:   1. Abdominal pain during pregnancy in third trimester   2. [redacted] weeks gestation of pregnancy   3. Gastroesophageal reflux disease, esophagitis presence not specified    DC home Comfort measures reviewed  3rd Trimester precautions  PTL precautions  Fetal kick counts RX: protonix QD #30  Return to MAU as needed FU with OB as planned Return to MAU Friday morning for second BMZ injection.   Follow-up Information    Geryl Rankins, MD Follow up in 1 week(s).   Specialty:  Obstetrics and Gynecology Contact information: [address] [place] [address] [phone]

## 2016-10-08 NOTE — Discharge Instructions (Signed)

## 2016-10-09 ENCOUNTER — Inpatient Hospital Stay (HOSPITAL_COMMUNITY)
Admission: AD | Admit: 2016-10-09 | Discharge: 2016-10-09 | Disposition: A | Discharge status: Home / Self Care | Payer: 59 | Source: Ambulatory Visit | Attending: Obstetrics and Gynecology | Admitting: Obstetrics and Gynecology

## 2016-10-09 DIAGNOSIS — Z3A33 33 weeks gestation of pregnancy: Secondary | ICD-10-CM | POA: Diagnosis not present

## 2016-10-09 DIAGNOSIS — O26893 Other specified pregnancy related conditions, third trimester: Secondary | ICD-10-CM | POA: Diagnosis present

## 2016-10-09 DIAGNOSIS — R109 Unspecified abdominal pain: Secondary | ICD-10-CM | POA: Insufficient documentation

## 2016-10-09 LAB — URINE CULTURE: Culture: NO GROWTH

## 2016-10-09 MED ORDER — BETAMETHASONE SOD PHOS & ACET 6 (3-3) MG/ML IJ SUSP
12.0000 mg | Freq: Once | INTRAMUSCULAR | Status: AC
  Administered 2016-10-09: 12 mg via INTRAMUSCULAR
  Filled 2016-10-09: qty 2

## 2016-10-16 ENCOUNTER — Encounter (HOSPITAL_COMMUNITY): Payer: Self-pay

## 2016-10-16 ENCOUNTER — Inpatient Hospital Stay (HOSPITAL_COMMUNITY)
Admission: AD | Admit: 2016-10-16 | Discharge: 2016-10-17 | Disposition: A | Discharge status: Home / Self Care | Payer: 59 | Source: Ambulatory Visit | Attending: Obstetrics and Gynecology | Admitting: Obstetrics and Gynecology

## 2016-10-16 DIAGNOSIS — Z3A33 33 weeks gestation of pregnancy: Secondary | ICD-10-CM | POA: Diagnosis not present

## 2016-10-16 DIAGNOSIS — R109 Unspecified abdominal pain: Secondary | ICD-10-CM | POA: Diagnosis present

## 2016-10-16 DIAGNOSIS — O479 False labor, unspecified: Secondary | ICD-10-CM

## 2016-10-16 LAB — URINALYSIS, ROUTINE W REFLEX MICROSCOPIC
Bilirubin Urine: NEGATIVE
Glucose, UA: 150 mg/dL — AB
KETONES UR: NEGATIVE mg/dL
Nitrite: NEGATIVE
PH: 6 (ref 5.0–8.0)
PROTEIN: 30 mg/dL — AB
Specific Gravity, Urine: 1.016 (ref 1.005–1.030)

## 2016-10-16 NOTE — MAU Provider Note (Signed)
Chief Complaint:  Abdominal Pain; Headache; and Hot Flashes   First Provider Initiated Contact with Patient 10/16/16 2355      HPI: Megan Johnson is a 31 y.o. [phone] at [redacted]w[redacted]d who presents to maternity admissions reporting left sided pain, with headache and feeling hot.  Patient was woken this morning from pain in left side. Pain comes/goes, located on left side of uterus. Pain is sharp, tightening feeling. Does not radiate. Does not need to breathe through. Unsure if contractions, she has never felt contractions before.   Headache started this AM as well. Took tylenol , this AM at about 11am, but did not relieve any of her headache. No changes in vision. Denies N/V. No history of migraines. Drinking fluid and eating today. Getting over recent cold.  Denies leakage of fluid or vaginal bleeding. Good fetal movement.   Pregnancy Course:   Past Medical History: History reviewed. No pertinent past medical history.  Past obstetric history: OB History  Gravida Para Term Preterm AB Living  SAB TAB Ectopic Multiple Live Births  1       1    # Outcome Date GA Lbr Len/2nd Weight Sex Delivery Anes PTL Lv  3 Current           2 Preterm 05/24/14 [redacted]w[redacted]d  6 lb 5.2 oz (2.87 kg) F CS-LTranv Spinal, EPI  LIV  1 SAB               Past Surgical History: Past Surgical History:  Procedure Laterality Date  . CESAREAN SECTION N/A 05/24/2014   Procedure: CESAREAN SECTION;  Surgeon: Konrad Felix, MD;  Location: [facility];  Service: Obstetrics;  Laterality: N/A;  . WISDOM TOOTH EXTRACTION       Family History: Family History  Problem Relation Age of Onset  . Alcohol abuse Neg Hx   . Arthritis Neg Hx   . Asthma Neg Hx   . Birth defects Neg Hx   . Cancer Neg Hx   . COPD Neg Hx   . Depression Neg Hx   . Diabetes Neg Hx   . Drug abuse Neg Hx   . Early death Neg Hx   . Hearing loss Neg Hx   . Heart disease Neg Hx   . Hyperlipidemia Neg Hx   . Kidney disease Neg Hx    . Hypertension Neg Hx   . Learning disabilities Neg Hx   . Mental illness Neg Hx   . Mental retardation Neg Hx   . Miscarriages / Stillbirths Neg Hx   . Stroke Neg Hx   . Vision loss Neg Hx   . Varicose Veins Neg Hx     Social History: Social History  Substance Use Topics  . Smoking status: Never Smoker  . Smokeless tobacco: Never Used  . Alcohol use No    Allergies: No Known Allergies  Meds:  Prescriptions Prior to Admission  Medication Sig Dispense Refill Last Dose  . acetaminophen (TYLENOL) 500 MG tablet Take 1,000 mg by mouth every 6 (six) hours as needed for moderate pain.    10/16/2016 at Unknown time  . Prenatal Vit-Fe Fumarate-FA (PRENATAL MULTIVITAMIN) TABS tablet Take 1 tablet by mouth daily at 12 noon.   10/15/2016 at Unknown time  . pantoprazole (PROTONIX) 40 MG tablet Take 1 tablet (40 mg total) by mouth daily. 30 tablet 1     I have reviewed patient's Past Medical Hx, Surgical Hx,  Family Hx, Social Hx, medications and allergies.   ROS:  A comprehensive ROS was negative except per HPI.    Physical Exam   Patient Vitals for the past 24 hrs:  BP Temp Temp src Pulse Resp SpO2 Height Weight  10/17/16 0256 100/63 98 F (36.7 C) Oral 89 16 - - -  10/17/16 0157 101/65 - - - - - - -  10/16/16 2256 - - - 91 - 97 % - -  10/16/16 2251 - - - 92 - 97 % - -  10/16/16 2247 - - - 95 - - - -  10/16/16 2246 101/59 98.5 F (36.9 C) Oral 94 16 97 % - -  10/16/16 2227 101/60 98.9 F (37.2 C) - 89 18 - 5' (1.524 m) 155 lb 3.2 oz (70.4 kg)   Constitutional: Well-developed, well-nourished female in no acute distress.  Cardiovascular: normal rate, rhythm, no murmurs Respiratory: normal effort, CTAB GI: Abd soft, nontender, gravid appropriate for gestational age. Pos BS x 4 MS: Extremities nontender, no edema, normal ROM Neurologic: Alert and oriented x 4.  GU: Neg CVAT. SVE: Dilation: Closed Effacement (%): Thick Cervical Position: Posterior Station: -3 Exam by:: Dr.  Omer Jack    Labs: Results for orders placed or performed during the hospital encounter of 10/16/16 (from the past 24 hour(s))  Urinalysis, Routine w reflex microscopic     Status: Abnormal   Collection Time: 10/16/16 10:35 PM  Result Value Ref Range   Color, Urine YELLOW YELLOW   APPearance HAZY (A) CLEAR   Specific Gravity, Urine 1.016 1.005 - 1.030   pH 6.0 5.0 - 8.0   Glucose, UA 150 (A) NEGATIVE mg/dL   Hgb urine dipstick MODERATE (A) NEGATIVE   Bilirubin Urine NEGATIVE NEGATIVE   Ketones, ur NEGATIVE NEGATIVE mg/dL   Protein, ur 30 (A) NEGATIVE mg/dL   Nitrite NEGATIVE NEGATIVE   Leukocytes, UA TRACE (A) NEGATIVE   RBC / HPF 6-30 0 - 5 RBC/hpf   WBC, UA 0-5 0 - 5 WBC/hpf   Bacteria, UA RARE (A) NONE SEEN   Squamous Epithelial / LPF 0-5 (A) NONE SEEN   Mucous PRESENT   Fetal fibronectin     Status: None   Collection Time: 10/17/16 12:10 AM  Result Value Ref Range   Fetal Fibronectin NEGATIVE NEGATIVE  Wet prep, genital     Status: Abnormal   Collection Time: 10/17/16 12:10 AM  Result Value Ref Range   Yeast Wet Prep HPF POC NONE SEEN NONE SEEN   Trich, Wet Prep NONE SEEN NONE SEEN   Clue Cells Wet Prep HPF POC NONE SEEN NONE SEEN   WBC, Wet Prep HPF POC MODERATE (A) NONE SEEN   Sperm NONE SEEN     Imaging:  No results found.  MAU Course: FFN - NEG S/P BMZ 2/8 and 2/9 NST - REACTIVE; irregular contractions about q8-10 min but diminished during MAU course Procardia x1 SVE - closed/thick/posterior Wet prep - NEG GC/CT - pending Fioricet - HA resolved after medication  I personally reviewed the patient's NST today, found to be REACTIVE. 140 bpm, mod var, +accels, no decels. CTX: Irregular, upon arrival about every 10 min then dissipated.  Spoke to Dr. Normand Sloop (on call for [facility]), who agrees likely not in preterm labor, OK for discharge.   MDM: Plan of care reviewed with patient, including labs and tests ordered and medical treatment. Does not appear patient  is in preterm labor given negative of the fan, infrequent and  dissipated contractions, as well as unfavorable cervix. Patient was given 1 dose of Procardia and contractions dissipated. Patient did seem slightly dizzy after dose of Procardia, therefore will not be sending patient home in medication [place] especially due to lower blood pressures. Strict return precautions were given to patient for preterm labor. Headache resolved with Fioricet, sent home with prescription for medication if needed.   Assessment: 1. Irregular uterine contractions     Plan: Discharge home in stable condition.  Preterm Labor precautions and fetal kick counts Already received BMZ Fioricet for headache, encouraged PO hydration.   Follow-up Information    [facility]. Schedule an appointment as soon as possible for a visit in 1 week(s).   Specialty:  Obstetrics and Gynecology Why:  Follow up with contractions  Contact information: [address] [place] [address] [phone]           Allergies as of 10/17/2016   No Known Allergies     Medication List    TAKE these medications   acetaminophen 500 MG tablet Commonly known as:  TYLENOL Take 1,000 mg by mouth every 6 (six) hours as needed for moderate pain.   butalbital-acetaminophen-caffeine 50-325-40 MG tablet Commonly known as:  FIORICET, ESGIC Take 1-2 tablets by mouth every 6 (six) hours as needed for headache.   pantoprazole 40 MG tablet Commonly known as:  PROTONIX Take 1 tablet (40 mg total) by mouth daily.   prenatal multivitamin Tabs tablet Take 1 tablet by mouth daily at 12 noon.       Jen Mow, DO OB Fellow [facility], [facility] 10/17/2016 3:24 AM

## 2016-10-16 NOTE — MAU Note (Addendum)
Having pain in L side of abd since this am. Also having hot flashes and headache since am. Denies LOF or bleeding. Took tylenol for headache and did not help. Pain in abdomen worse with movement. Getting over a cold

## 2016-10-17 LAB — WET PREP, GENITAL
CLUE CELLS WET PREP: NONE SEEN
SPERM: NONE SEEN
TRICH WET PREP: NONE SEEN
Yeast Wet Prep HPF POC: NONE SEEN

## 2016-10-17 LAB — FETAL FIBRONECTIN: Fetal Fibronectin: NEGATIVE

## 2016-10-17 MED ORDER — NIFEDIPINE 10 MG PO CAPS
10.0000 mg | ORAL_CAPSULE | Freq: Once | ORAL | Status: AC
  Administered 2016-10-17: 10 mg via ORAL
  Filled 2016-10-17: qty 1

## 2016-10-17 MED ORDER — BUTALBITAL-APAP-CAFFEINE 50-325-40 MG PO TABS
1.0000 | ORAL_TABLET | Freq: Once | ORAL | Status: AC
  Administered 2016-10-17: 1 via ORAL
  Filled 2016-10-17: qty 1

## 2016-10-17 MED ORDER — BUTALBITAL-APAP-CAFFEINE 50-325-40 MG PO TABS: 1.0000 | ORAL_TABLET | Freq: Four times a day (QID) | ORAL | 0 refills | Status: DC | PRN

## 2016-10-17 NOTE — Discharge Instructions (Signed)
Preterm Labor and Birth Information °Pregnancy normally lasts 39-41 weeks. Preterm labor is when labor starts early. It starts before you have been pregnant for 37 whole weeks. °What are the risk factors for preterm labor? °Preterm labor is more likely to occur in women who: °· Have an infection while pregnant. °· Have a cervix that is short. °· Have gone into preterm labor before. °· Have had surgery on their cervix. °· Are younger than age 30. °· Are older than age 35. °· Are African American. °· Are pregnant with two or more babies. °· Take street drugs while pregnant. °· Smoke while pregnant. °· Do not gain enough weight while pregnant. °· Got pregnant right after another pregnancy. °What are the symptoms of preterm labor? °Symptoms of preterm labor include: °· Cramps. The cramps may feel like the cramps some women get during their period. The cramps may happen with watery poop (diarrhea). °· Pain in the belly (abdomen). °· Pain in the lower back. °· Regular contractions or tightening. It may feel like your belly is getting tighter. °· Pressure in the lower belly that seems to get stronger. °· More fluid (discharge) leaking from the vagina. The fluid may be watery or bloody. °· Water breaking. °Why is it important to notice signs of preterm labor? °Babies who are born early may not be fully developed. They have a higher chance for: °· Long-term heart problems. °· Long-term lung problems. °· Trouble controlling body systems, like breathing. °· Bleeding in the brain. °· A condition called cerebral palsy. °· Learning difficulties. °· Death. °These risks are highest for babies who are born before 34 weeks of pregnancy. °How is preterm labor treated? °Treatment depends on: °· How long you were pregnant. °· Your condition. °· The health of your baby. °Treatment may involve: °· Having a stitch (suture) placed in your cervix. When you give birth, your cervix opens so the baby can come out. The stitch keeps the cervix  from opening too soon. °· Staying at the hospital. °· Taking or getting medicines, such as: °¨ Hormone medicines. °¨ Medicines to stop contractions. °¨ Medicines to help the baby’s lungs develop. °¨ Medicines to prevent your baby from having cerebral palsy. °What should I do if I am in preterm labor? °If you think you are going into labor too soon, call your doctor right away. °How can I prevent preterm labor? °· Do not use any tobacco products. °¨ Examples of these are cigarettes, chewing tobacco, and e-cigarettes. °¨ If you need help quitting, ask your doctor. °· Do not use street drugs. °· Do not use any medicines unless you ask your doctor if they are safe for you. °· Talk with your doctor before taking any herbal supplements. °· Make sure you gain enough weight. °· Watch for infection. If you think you might have an infection, get it checked right away. °· If you have gone into preterm labor before, tell your doctor. °This information is not intended to replace advice given to you by your health care provider. Make sure you discuss any questions you have with your health care provider. °Document Released: 11/13/2008 Document Revised: 01/28/2016 Document Reviewed: 01/08/2016 °Elsevier Interactive Patient Education © 2017 [employer] ° °

## 2016-10-19 LAB — GC/CHLAMYDIA PROBE AMP (~~LOC~~) NOT AT ARMC
Chlamydia: NEGATIVE
Neisseria Gonorrhea: NEGATIVE

## 2016-11-02 ENCOUNTER — Encounter (HOSPITAL_COMMUNITY): Payer: Self-pay | Admitting: *Deleted

## 2016-11-02 ENCOUNTER — Inpatient Hospital Stay (HOSPITAL_COMMUNITY)
Admission: AD | Admit: 2016-11-02 | Discharge: 2016-11-02 | Disposition: A | Discharge status: Home / Self Care | Payer: 59 | Source: Ambulatory Visit | Attending: Obstetrics and Gynecology | Admitting: Obstetrics and Gynecology

## 2016-11-02 DIAGNOSIS — O4703 False labor before 37 completed weeks of gestation, third trimester: Secondary | ICD-10-CM | POA: Diagnosis not present

## 2016-11-02 DIAGNOSIS — O34211 Maternal care for low transverse scar from previous cesarean delivery: Secondary | ICD-10-CM | POA: Insufficient documentation

## 2016-11-02 DIAGNOSIS — O479 False labor, unspecified: Secondary | ICD-10-CM

## 2016-11-02 DIAGNOSIS — Z3A35 35 weeks gestation of pregnancy: Secondary | ICD-10-CM | POA: Insufficient documentation

## 2016-11-02 LAB — URINALYSIS, ROUTINE W REFLEX MICROSCOPIC
BILIRUBIN URINE: NEGATIVE
Glucose, UA: NEGATIVE mg/dL
Ketones, ur: NEGATIVE mg/dL
LEUKOCYTES UA: NEGATIVE
NITRITE: NEGATIVE
PH: 7 (ref 5.0–8.0)
Protein, ur: NEGATIVE mg/dL
SPECIFIC GRAVITY, URINE: 1.009 (ref 1.005–1.030)

## 2016-11-02 NOTE — MAU Provider Note (Signed)
History     CSN: [id number]  Arrival date and time: 11/02/16 4098   First Provider Initiated Contact with Patient 11/02/16 [phone]      Chief Complaint  Patient presents with  . Contractions   HPI   Megan Johnson is a 31 y.o. female [phone] @ [redacted]w[redacted]d here in MAU with contractions. The contractions started at 0430 this morning and they were strong and regular. Since her arrival she says that the contractions have "wore off". She is not feeling the contractions much since arrival. She denies leaking of water or vaginal bleeding. + fetal movement.  Patient had an elevated PCR earlier this month and received a course of BMZ. She has not had any problems with her blood pressure.   OB History    Gravida Para Term Preterm AB Living   3 1   1 1 1    SAB TAB Ectopic Multiple Live Births   1       1      Past Medical History:  Diagnosis Date  . Medical history non-contributory     Past Surgical History:  Procedure Laterality Date  . CESAREAN SECTION N/A 05/24/2014   Procedure: CESAREAN SECTION;  Surgeon: Konrad Felix, MD;  Location: [facility];  Service: Obstetrics;  Laterality: N/A;  . WISDOM TOOTH EXTRACTION      Family History  Problem Relation Age of Onset  . Alcohol abuse Neg Hx   . Arthritis Neg Hx   . Asthma Neg Hx   . Birth defects Neg Hx   . Cancer Neg Hx   . COPD Neg Hx   . Depression Neg Hx   . Diabetes Neg Hx   . Drug abuse Neg Hx   . Early death Neg Hx   . Hearing loss Neg Hx   . Heart disease Neg Hx   . Hyperlipidemia Neg Hx   . Kidney disease Neg Hx   . Hypertension Neg Hx   . Learning disabilities Neg Hx   . Mental illness Neg Hx   . Mental retardation Neg Hx   . Miscarriages / Stillbirths Neg Hx   . Stroke Neg Hx   . Vision loss Neg Hx   . Varicose Veins Neg Hx     Social History  Substance Use Topics  . Smoking status: Never Smoker  . Smokeless tobacco: Never Used  . Alcohol use No    Allergies: No Known Allergies  Prescriptions  Prior to Admission  Medication Sig Dispense Refill Last Dose  . acetaminophen (TYLENOL) 500 MG tablet Take 1,000 mg by mouth every 6 (six) hours as needed for moderate pain.    10/16/2016 at Unknown time  . butalbital-acetaminophen-caffeine (FIORICET, ESGIC) 50-325-40 MG tablet Take 1-2 tablets by mouth every 6 (six) hours as needed for headache. 20 tablet 0   . pantoprazole (PROTONIX) 40 MG tablet Take 1 tablet (40 mg total) by mouth daily. 30 tablet 1   . Prenatal Vit-Fe Fumarate-FA (PRENATAL MULTIVITAMIN) TABS tablet Take 1 tablet by mouth daily at 12 noon.   10/15/2016 at Unknown time   Results for orders placed or performed during the hospital encounter of 11/02/16 (from the past 48 hour(s))  Urinalysis, Routine w reflex microscopic     Status: Abnormal   Collection Time: 11/02/16  8:20 AM  Result Value Ref Range   Color, Urine STRAW (A) YELLOW   APPearance CLEAR CLEAR   Specific Gravity, Urine 1.009 1.005 - 1.030   pH 7.0  5.0 - 8.0   Glucose, UA NEGATIVE NEGATIVE mg/dL   Hgb urine dipstick MODERATE (A) NEGATIVE   Bilirubin Urine NEGATIVE NEGATIVE   Ketones, ur NEGATIVE NEGATIVE mg/dL   Protein, ur NEGATIVE NEGATIVE mg/dL   Nitrite NEGATIVE NEGATIVE   Leukocytes, UA NEGATIVE NEGATIVE   RBC / HPF 0-5 0 - 5 RBC/hpf   WBC, UA 0-5 0 - 5 WBC/hpf   Bacteria, UA RARE (A) NONE SEEN   Squamous Epithelial / LPF 6-30 (A) NONE SEEN   Mucous PRESENT     Review of Systems  Constitutional: Negative for fever.  Genitourinary: Negative for vaginal bleeding and vaginal discharge.   Physical Exam   Blood pressure 103/63, pulse 87, temperature 98.2 F (36.8 C), temperature source Oral, resp. rate 16, weight 159 lb 4 oz (72.2 kg), SpO2 97 %, unknown if currently breastfeeding.  Physical Exam  Constitutional: She is oriented to person, place, and time. She appears well-developed and well-nourished. No distress.  Genitourinary:  Genitourinary Comments: Cervix: Closed, thick, posterior.    Musculoskeletal: Normal range of motion.  Neurological: She is alert and oriented to person, place, and time.  Skin: Skin is warm. She is not diaphoretic.  Psychiatric: Her behavior is normal.   Fetal Tracing: Baseline: 140 bpm  Variability: Moderate  Accelerations: 15x15 Decelerations: None Toco: Occasional contraction   MAU Course  Procedures  None  MDM  Discussed patient with Dr. Richardson Doppole @ 0915: Ok to dc home with preterm labor precautions.   Assessment and Plan   A:  1. Braxton Hicks contractions     P:  Discharge home in stable condition Preterm labor precautions, strict return precautions Follow up with Dr. Richardson Doppole as scheduled    Duane LopeJennifer I Shaquille Janes, NP 11/02/2016 9:37 AM

## 2016-11-02 NOTE — MAU Note (Signed)
Contractions woke her at 0300, started timing at 0430.  Are every 6 min. Denies bleeding or leaking. Prior c/s, plans for vbac

## 2016-11-02 NOTE — MAU Note (Signed)
Was given procardia, only took it once.  Was told to stop, would just deliver at this point. Has received Betamethasone

## 2016-11-02 NOTE — Discharge Instructions (Signed)

## 2016-11-11 ENCOUNTER — Encounter (HOSPITAL_COMMUNITY): Payer: Self-pay

## 2016-11-11 ENCOUNTER — Inpatient Hospital Stay (HOSPITAL_COMMUNITY)
Admission: AD | Admit: 2016-11-11 | Discharge: 2016-11-12 | Disposition: A | Discharge status: Home / Self Care | Payer: 59 | Source: Ambulatory Visit | Attending: Obstetrics and Gynecology | Admitting: Obstetrics and Gynecology

## 2016-11-11 DIAGNOSIS — O26893 Other specified pregnancy related conditions, third trimester: Secondary | ICD-10-CM | POA: Diagnosis not present

## 2016-11-11 DIAGNOSIS — O479 False labor, unspecified: Secondary | ICD-10-CM

## 2016-11-11 DIAGNOSIS — Z3A37 37 weeks gestation of pregnancy: Secondary | ICD-10-CM | POA: Diagnosis not present

## 2016-11-11 DIAGNOSIS — R21 Rash and other nonspecific skin eruption: Secondary | ICD-10-CM | POA: Diagnosis not present

## 2016-11-11 MED ORDER — LORATADINE 10 MG PO TABS: 10.0000 mg | ORAL_TABLET | Freq: Every day | ORAL | Status: DC

## 2016-11-11 MED ORDER — LORATADINE 10 MG PO TABS
10.0000 mg | ORAL_TABLET | Freq: Once | ORAL | Status: AC
  Administered 2016-11-11: 10 mg via ORAL
  Filled 2016-11-11: qty 1

## 2016-11-11 MED ORDER — LORATADINE 10 MG PO TABS: 10.0000 mg | ORAL_TABLET | Freq: Every day | ORAL | 1 refills | Status: AC

## 2016-11-11 NOTE — MAU Provider Note (Signed)
Chief Complaint:  No chief complaint on file.   First Provider Initiated Contact with Patient 11/11/16 2230     HPI: Megan Johnson is a 31 y.o. [phone] [id number] at 1237w0dwho presents to maternity admissions reporting uterine contractions since 5pm and itchy rash on upper body. Had the rash a month ago and it went away. She reports good fetal movement, denies LOF, vaginal bleeding, vaginal itching/burning, urinary symptoms, h/a, dizziness, n/v, diarrhea, constipation or fever/chills.  She denies headache, visual changes or RUQ abdominal pain.   History is remarkable for previous C/S.  Plans TOLAC  Abdominal Pain  This is a new problem. The current episode started today. The onset quality is gradual. The problem occurs intermittently. The problem has been unchanged. The pain is located in the LLQ, RLQ and suprapubic region. The pain is mild. The quality of the pain is cramping. The abdominal pain does not radiate. Pertinent negatives include no anorexia, constipation, diarrhea, dysuria, fever, frequency, headaches, myalgias, nausea or vomiting. Nothing aggravates the pain. The pain is relieved by nothing. She has tried nothing for the symptoms.  Allergic Reaction  This is a recurrent problem. The current episode started more than 1 week ago. The problem occurs constantly. The problem is unchanged. The problem is moderate. It is unknown what she was exposed to. The time of exposure is unknown. Associated symptoms include abdominal pain, itching and a rash. Pertinent negatives include no chest pressure, coughing, diarrhea, difficulty breathing, eye watering, stridor, trouble swallowing, vomiting or wheezing. There is no swelling present. Past treatments include nothing. There is no history of asthma.    RN Note: Pt presents with compliant of contractions since 1730 and also reports a rash on her upper body since yesterday. Reports the rash itches.   Past Medical History: Past Medical History:   Diagnosis Date  . Medical history non-contributory     Past obstetric history: OB History  Gravida Para Term Preterm AB Living  3 1   1 1 1   SAB TAB Ectopic Multiple Live Births  1       1    # Outcome Date GA Lbr Len/2nd Weight Sex Delivery Anes PTL Lv  3 Current           2 Preterm 05/24/14 [id number]  6 lb 5.2 oz (2.87 kg) F CS-LTranv Spinal, EPI  LIV  1 SAB               Past Surgical History: Past Surgical History:  Procedure Laterality Date  . CESAREAN SECTION N/A 05/24/2014   Procedure: CESAREAN SECTION;  Surgeon: Konrad FelixEma Wakuru Kulwa, MD;  Location: [facility];  Service: Obstetrics;  Laterality: N/A;  . WISDOM TOOTH EXTRACTION      Family History: Family History  Problem Relation Age of Onset  . Alcohol abuse Neg Hx   . Arthritis Neg Hx   . Asthma Neg Hx   . Birth defects Neg Hx   . Cancer Neg Hx   . COPD Neg Hx   . Depression Neg Hx   . Diabetes Neg Hx   . Drug abuse Neg Hx   . Early death Neg Hx   . Hearing loss Neg Hx   . Heart disease Neg Hx   . Hyperlipidemia Neg Hx   . Kidney disease Neg Hx   . Hypertension Neg Hx   . Learning disabilities Neg Hx   . Mental illness Neg Hx   . Mental retardation Neg Hx   . Miscarriages /  Stillbirths Neg Hx   . Stroke Neg Hx   . Vision loss Neg Hx   . Varicose Veins Neg Hx     Social History: Social History  Substance Use Topics  . Smoking status: Never Smoker  . Smokeless tobacco: Never Used  . Alcohol use No    Allergies: No Known Allergies  Meds:  Prescriptions Prior to Admission  Medication Sig Dispense Refill Last Dose  . Prenatal Vit-Fe Fumarate-FA (PRENATAL MULTIVITAMIN) TABS tablet Take 1 tablet by mouth daily at 12 noon.   11/01/2016 at Unknown time    I have reviewed patient's Past Medical Hx, Surgical Hx, Family Hx, Social Hx, medications and allergies.   ROS:  Review of Systems  Constitutional: Negative for fever.  HENT: Negative for trouble swallowing.   Respiratory: Negative for cough, wheezing  and stridor.   Gastrointestinal: Positive for abdominal pain. Negative for anorexia, constipation, diarrhea, nausea and vomiting.  Genitourinary: Negative for dysuria and frequency.  Musculoskeletal: Negative for myalgias.  Skin: Positive for itching and rash.  Neurological: Negative for headaches.   Other systems negative  Physical Exam  Patient Vitals for the past 24 hrs:  BP Temp Temp src Pulse Resp SpO2 Height Weight  11/11/16 2209 99/59 98.6 F (37 C) Oral 84 16 98 % 4\' 11"  (1.499 m) 168 lb (76.2 kg)   Constitutional: Well-developed, well-nourished female in no acute distress.  Cardiovascular: normal rate and rhythm Respiratory: normal effort, clear to auscultation bilaterally GI: Abd soft, non-tender, gravid appropriate for gestational age.   No rebound or guarding. MS: Extremities nontender, no edema, normal ROM Neurologic: Alert and oriented x 4.  GU: Neg CVAT.  PELVIC EXAM:  Dilation: Closed Effacement (%): 60 Station: Ballotable Presentation: Vertex Exam by:: Artelia Laroche CNM  FHT:  Baseline 130 , moderate variability, accelerations present, no decelerations Contractions:  Irregular  Every 6-10 min, mild, patient does not have to breathe through them   Labs:   No results found for this or any previous visit (from the past 24 hour(s)).    Imaging:  No results found.  MAU Course/MDM: NST reviewed and found to be reactive Consult Dr Richardson Dopp with presentation, exam findings and test results.  Treatments in MAU included Claritin for itching and rash.  EFM.Marland Kitchen    Assessment: Single IUP at [redacted]w[redacted]d Irregular uterine contractions, not in labor Rash with itching, possible allergic response  Plan: Discharge home Labor precautions and fetal kick counts Follow up in Office for prenatal visits and recheck of cervix Strict labor/ROM/Bleeding/FM precautions. Urged to return if UCs worsen or persists close together Rx Claritin for rash for home use Has appt tomorrow, will get  rechecked  Encouraged to return here or to other Urgent Care/ED if she develops worsening of symptoms, increase in pain, fever, or other concerning symptoms.   Pt stable at time of discharge.  Wynelle Bourgeois CNM, MSN Certified Nurse-Midwife 11/11/2016 10:59 PM

## 2016-11-11 NOTE — MAU Note (Signed)
Pt presents with compliant of contractions since 1730 and also reports a rash on her upper body since yesterday. Reports the rash itches.

## 2016-11-11 NOTE — Discharge Instructions (Signed)
Allergies An allergy is when your body reacts to a substance in a way that is not normal. An allergic reaction can happen after you:  Eat something.  Breathe in something.  Touch something. You can be allergic to:  Things that are only around during certain seasons, like molds and pollens.  Foods.  Drugs.  Insects.  Animal dander. What are the signs or symptoms?  Puffiness (swelling). This may happen on the lips, face, tongue, mouth, or throat.  Sneezing.  Coughing.  Breathing loudly (wheezing).  Stuffy nose.  Tingling in the mouth.  A rash.  Itching.  Itchy, red, puffy areas of skin (hives).  Watery eyes.  Throwing up (vomiting).  Watery poop (diarrhea).  Dizziness.  Feeling faint or fainting.  Trouble breathing or swallowing.  A tight feeling in the chest.  A fast heartbeat. How is this diagnosed? Allergies can be diagnosed with:  A medical and family history.  Skin tests.  Blood tests.  A food diary. A food diary is a record of all the foods, drinks, and symptoms you have each day.  The results of an elimination diet. This diet involves making sure not to eat certain foods and then seeing what happens when you start eating them again. How is this treated? There is no cure for allergies, but allergic reactions can be treated with medicine. Severe reactions usually need to be treated at a hospital. How is this prevented? The best way to prevent an allergic reaction is to avoid the thing you are allergic to. Allergy shots and medicines can also help prevent reactions in some cases. This information is not intended to replace advice given to you by your health care provider. Make sure you discuss any questions you have with your health care provider. Document Released: 12/12/2012 Document Revised: 04/13/2016 Document Reviewed: 05/29/2014 Elsevier Interactive Patient Education  2017 [employer] Inc[employer]  Vaginal Delivery Vaginal delivery means that  you will give birth by pushing your baby out of your birth canal (vagina). A team of health care providers will help you before, during, and after vaginal delivery. Birth experiences are unique for every woman and every pregnancy, and birth experiences vary depending on where you choose to give birth. What should I do to prepare for my baby's birth? Before your baby is born, it is important to talk with your health care provider about:  Your labor and delivery preferences. These may include:  Medicines that you may be given.  How you will manage your pain. This might include non-medical pain relief techniques or injectable pain relief such as epidural analgesia.  How you and your baby will be monitored during labor and delivery.  Who may be in the labor and delivery room with you.  Your feelings about surgical delivery of your baby (cesarean delivery, or C-section) if this becomes necessary.  Your feelings about receiving donated blood through an IV tube (blood transfusion) if this becomes necessary.  Whether you are able:  To take pictures or videos of the birth.  To eat during labor and delivery.  To move around, walk, or change positions during labor and delivery.  What to expect after your baby is born, such as:  Whether delayed umbilical cord clamping and cutting is offered.  Who will care for your baby right after birth.  Medicines or tests that may be recommended for your baby.  Whether breastfeeding is supported in your hospital or birth center.  How long you will be in the hospital or  birth center.  How any medical conditions you have may affect your baby or your labor and delivery experience. To prepare for your baby's birth, you should also:  Attend all of your health care visits before delivery (prenatal visits) as recommended by your health care provider. This is important.  Prepare your home for your baby's arrival. Make sure that you have:  Diapers.  Baby  clothing.  Feeding equipment.  Safe sleeping arrangements for you and your baby.  Install a car seat in your vehicle. Have your car seat checked by a certified car seat installer to make sure that it is installed safely.  Think about who will help you with your new baby at home for at least the first several weeks after delivery. What can I expect when I arrive at the birth center or hospital? Once you are in labor and have been admitted into the hospital or birth center, your health care provider may:  Review your pregnancy history and any concerns you have.  Insert an IV tube into one of your veins. This is used to give you fluids and medicines.  Check your blood pressure, pulse, temperature, and heart rate (vital signs).  Check whether your bag of water (amniotic sac) has broken (ruptured).  Talk with you about your birth plan and discuss pain control options. Monitoring  Your health care provider may monitor your contractions (uterine monitoring) and your baby's heart rate (fetal monitoring). You may need to be monitored:  Often, but not continuously (intermittently).  All the time or for long periods at a time (continuously). Continuous monitoring may be needed if:  You are taking certain medicines, such as medicine to relieve pain or make your contractions stronger.  You have pregnancy or labor complications. Monitoring may be done by:  Placing a special stethoscope or a handheld monitoring device on your abdomen to check your baby's heartbeat, and feeling your abdomen for contractions. This method of monitoring does not continuously record your baby's heartbeat or your contractions.  Placing monitors on your abdomen (external monitors) to record your baby's heartbeat and the frequency and length of contractions. You may not have to wear external monitors all the time.  Placing monitors inside of your uterus (internal monitors) to record your baby's heartbeat and the  frequency, length, and strength of your contractions.  Your health care provider may use internal monitors if he or she needs more information about the strength of your contractions or your baby's heart rate.  Internal monitors are put in place by passing a thin, flexible wire through your vagina and into your uterus. Depending on the type of monitor, it may remain in your uterus or on your baby's head until birth.  Your health care provider will discuss the benefits and risks of internal monitoring with you and will ask for your permission before inserting the monitors.  Telemetry. This is a type of continuous monitoring that can be done with external or internal monitors. Instead of having to stay in bed, you are able to move around during telemetry. Ask your health care provider if telemetry is an option for you. Physical exam  Your health care provider may perform a physical exam. This may include:  Checking whether your baby is positioned:  With the head toward your vagina (head-down). This is most common.  With the head toward the top of your uterus (head-up or breech). If your baby is in a breech position, your health care provider may try to turn  your baby to a head-down position so you can deliver vaginally. If it does not seem that your baby can be born vaginally, your provider may recommend surgery to deliver your baby. In rare cases, you may be able to deliver vaginally if your baby is head-up (breech delivery).  Lying sideways (transverse). Babies that are lying sideways cannot be delivered vaginally.  Checking your cervix to determine:  Whether it is thinning out (effacing).  Whether it is opening up (dilating).  How low your baby has moved into your birth canal. What are the three stages of labor and delivery?   Normal labor and delivery is divided into the following three stages: Stage 1   Stage 1 is the longest stage of labor, and it can last for hours or days. Stage  1 includes:  Early labor. This is when contractions may be irregular, or regular and mild. Generally, early labor contractions are more than 10 minutes apart.  Active labor. This is when contractions get longer, more regular, more frequent, and more intense.  The transition phase. This is when contractions happen very close together, are very intense, and may last longer than during any other part of labor.  Contractions generally feel mild, infrequent, and irregular at first. They get stronger, more frequent (about every 2-3 minutes), and more regular as you progress from early labor through active labor and transition.  Many women progress through stage 1 naturally, but you may need help to continue making progress. If this happens, your health care provider may talk with you about:  Rupturing your amniotic sac if it has not ruptured yet.  Giving you medicine to help make your contractions stronger and more frequent.  Stage 1 ends when your cervix is completely dilated to 4 inches (10 cm) and completely effaced. This happens at the end of the transition phase. Stage 2   Once your cervix is completely effaced and dilated to 4 inches (10 cm), you may start to feel an urge to push. It is common for the body to naturally take a rest before feeling the urge to push, especially if you received an epidural or certain other pain medicines. This rest period may last for up to 1-2 hours, depending on your unique labor experience.  During stage 2, contractions are generally less painful, because pushing helps relieve contraction pain. Instead of contraction pain, you may feel stretching and burning pain, especially when the widest part of your baby's head passes through the vaginal opening (crowning).  Your health care provider will closely monitor your pushing progress and your baby's progress through the vagina during stage 2.  Your health care provider may massage the area of skin between your  vaginal opening and anus (perineum) or apply warm compresses to your perineum. This helps it stretch as the baby's head starts to crown, which can help prevent perineal tearing.  In some cases, an incision may be made in your perineum (episiotomy) to allow the baby to pass through the vaginal opening. An episiotomy helps to make the opening of the vagina larger to allow more room for the baby to fit through.  It is very important to breathe and focus so your health care provider can control the delivery of your baby's head. Your health care provider may have you decrease the intensity of your pushing, to help prevent perineal tearing.  After delivery of your baby's head, the shoulders and the rest of the body generally deliver very quickly and without difficulty.  Once your baby is delivered, the umbilical cord may be cut right away, or this may be delayed for 1-2 minutes, depending on your baby's health. This may vary among health care providers, hospitals, and birth centers.  If you and your baby are healthy enough, your baby may be placed on your chest or abdomen to help maintain the baby's temperature and to help you bond with each other. Some mothers and babies start breastfeeding at this time. Your health care team will dry your baby and help keep your baby warm during this time.  Your baby may need immediate care if he or she:  Showed signs of distress during labor.  Has a medical condition.  Was born too early (prematurely).  Had a bowel movement before birth (meconium).  Shows signs of difficulty transitioning from being inside the uterus to being outside of the uterus. If you are planning to breastfeed, your health care team will help you begin a feeding. Stage 3   The third stage of labor starts immediately after the birth of your baby and ends after you deliver the placenta. The placenta is an organ that develops during pregnancy to provide oxygen and nutrients to your baby in  the womb.  Delivering the placenta may require some pushing, and you may have mild contractions. Breastfeeding can stimulate contractions to help you deliver the placenta.  After the placenta is delivered, your uterus should tighten (contract) and become firm. This helps to stop bleeding in your uterus. To help your uterus contract and to control bleeding, your health care provider may:  Give you medicine by injection, through an IV tube, by mouth, or through your rectum (rectally).  Massage your abdomen or perform a vaginal exam to remove any blood clots that are left in your uterus.  Empty your bladder by placing a thin, flexible tube (catheter) into your bladder.  Encourage you to breastfeed your baby. After labor is over, you and your baby will be monitored closely to ensure that you are both healthy until you are ready to go home. Your health care team will teach you how to care for yourself and your baby. This information is not intended to replace advice given to you by your health care provider. Make sure you discuss any questions you have with your health care provider. Document Released: 05/26/2008 Document Revised: 03/06/2016 Document Reviewed: 09/01/2015 Elsevier Interactive Patient Education  2017 [employer]

## 2016-11-12 DIAGNOSIS — O479 False labor, unspecified: Secondary | ICD-10-CM

## 2016-11-24 ENCOUNTER — Encounter (HOSPITAL_COMMUNITY)
Admission: RE | Admit: 2016-11-24 | Discharge: 2016-11-24 | Disposition: A | Discharge status: Home / Self Care | Payer: 59 | Source: Ambulatory Visit | Attending: Obstetrics and Gynecology | Admitting: Obstetrics and Gynecology

## 2016-11-24 ENCOUNTER — Encounter (HOSPITAL_COMMUNITY): Payer: Self-pay

## 2016-11-24 ENCOUNTER — Other Ambulatory Visit: Payer: Self-pay | Admitting: Obstetrics and Gynecology

## 2016-11-24 DIAGNOSIS — O1213 Gestational proteinuria, third trimester: Secondary | ICD-10-CM

## 2016-11-24 HISTORY — DX: Gestational (pregnancy-induced) hypertension without significant proteinuria, unspecified trimester: O13.9

## 2016-11-24 LAB — CBC
HCT: 34.1 % — ABNORMAL LOW (ref 36.0–46.0)
HEMOGLOBIN: 11.7 g/dL — AB (ref 12.0–15.0)
MCH: 32.6 pg (ref 26.0–34.0)
MCHC: 34.3 g/dL (ref 30.0–36.0)
MCV: 95 fL (ref 78.0–100.0)
Platelets: 137 10*3/uL — ABNORMAL LOW (ref 150–400)
RBC: 3.59 MIL/uL — ABNORMAL LOW (ref 3.87–5.11)
RDW: 13.5 % (ref 11.5–15.5)
WBC: 8.4 10*3/uL (ref 4.0–10.5)

## 2016-11-24 LAB — TYPE AND SCREEN
ABO/RH(D): O POS
Antibody Screen: NEGATIVE

## 2016-11-24 LAB — COMPREHENSIVE METABOLIC PANEL
ALT: 11 U/L — ABNORMAL LOW (ref 14–54)
ANION GAP: 7 (ref 5–15)
AST: 19 U/L (ref 15–41)
Albumin: 3 g/dL — ABNORMAL LOW (ref 3.5–5.0)
Alkaline Phosphatase: 103 U/L (ref 38–126)
BILIRUBIN TOTAL: 0.5 mg/dL (ref 0.3–1.2)
BUN: 7 mg/dL (ref 6–20)
CALCIUM: 8.4 mg/dL — AB (ref 8.9–10.3)
CO2: 24 mmol/L (ref 22–32)
CREATININE: 0.51 mg/dL (ref 0.44–1.00)
Chloride: 104 mmol/L (ref 101–111)
GFR calc non Af Amer: >60 mL/min (ref >60)
GLUCOSE: 82 mg/dL (ref 65–99)
Potassium: 3.9 mmol/L (ref 3.5–5.1)
SODIUM: 135 mmol/L (ref 135–145)
TOTAL PROTEIN: 6.5 g/dL (ref 6.5–8.1)

## 2016-11-24 LAB — PROTEIN / CREATININE RATIO, URINE
Creatinine, Urine: 45 mg/dL
Protein Creatinine Ratio: 0.8 mg/mg{Cre} — ABNORMAL HIGH (ref 0.00–0.15)
Total Protein, Urine: 36 mg/dL

## 2016-11-24 NOTE — Patient Instructions (Signed)
[address] [name]  11/24/2016   Your procedure is scheduled on:  11/25/2016  [facility] at 0845 AM.  Pick up the phone at the desk and dial [phone].   Call this number if you have problems the morning of surgery: [phone]   Remember:   Do not eat food:After Midnight.  Do not drink clear liquids: After Midnight.  Take these medicines the morning of surgery with A SIP OF WATER: none   Do not wear jewelry, make-up or nail polish.  Do not wear lotions, powders, or perfumes. Do not wear deodorant.  Do not shave 48 hours prior to surgery.  Do not bring valuables to the hospital.  [facility] is not   responsible for any belongings or valuables brought to the hospital.  Contacts, dentures or bridgework may not be worn into surgery.  Leave suitcase in the car. After surgery it may be brought to your room.  For patients admitted to the hospital, checkout time is 11:00 AM the day of              discharge.   Patients discharged the day of surgery will not be allowed to drive             home.  Name and phone number of your driver: na  Special Instructions:   N/A   Please read over the following fact sheets that you were given:   Surgical Site Infection Prevention

## 2016-11-24 NOTE — Pre-Procedure Instructions (Signed)
Pt arrived, visited with Admitting and labs were drawn.  Pt was given the preop soap, soap instruction sheet, skin to skin sheet and preop instruction sheet while in Admitting.  I called pt at home at 1330 and reviewed her Medical History with her along with discussing her preop instructions.  I ansewred all of her questions and pt will be arriving tomorrow morning for her CS as scheduled.

## 2016-11-25 ENCOUNTER — Encounter (HOSPITAL_COMMUNITY)
Admission: AD | Disposition: A | Discharge status: Home / Self Care | Payer: Self-pay | Source: Ambulatory Visit | Attending: Obstetrics and Gynecology

## 2016-11-25 ENCOUNTER — Inpatient Hospital Stay (HOSPITAL_COMMUNITY): Payer: 59 | Admitting: Anesthesiology

## 2016-11-25 ENCOUNTER — Encounter (HOSPITAL_COMMUNITY): Payer: Self-pay

## 2016-11-25 ENCOUNTER — Inpatient Hospital Stay (HOSPITAL_COMMUNITY)
Admission: AD | Admit: 2016-11-25 | Discharge: 2016-11-28 | DRG: 765 | Disposition: A | Discharge status: Home / Self Care | Payer: 59 | Source: Ambulatory Visit | Attending: Obstetrics and Gynecology | Admitting: Obstetrics and Gynecology

## 2016-11-25 DIAGNOSIS — D62 Acute posthemorrhagic anemia: Secondary | ICD-10-CM | POA: Diagnosis not present

## 2016-11-25 DIAGNOSIS — R0902 Hypoxemia: Secondary | ICD-10-CM | POA: Diagnosis present

## 2016-11-25 DIAGNOSIS — O9089 Other complications of the puerperium, not elsewhere classified: Secondary | ICD-10-CM | POA: Diagnosis present

## 2016-11-25 DIAGNOSIS — J811 Chronic pulmonary edema: Secondary | ICD-10-CM

## 2016-11-25 DIAGNOSIS — Z3A39 39 weeks gestation of pregnancy: Secondary | ICD-10-CM | POA: Diagnosis not present

## 2016-11-25 DIAGNOSIS — O134 Gestational [pregnancy-induced] hypertension without significant proteinuria, complicating childbirth: Principal | ICD-10-CM | POA: Diagnosis present

## 2016-11-25 DIAGNOSIS — O9081 Anemia of the puerperium: Secondary | ICD-10-CM | POA: Diagnosis not present

## 2016-11-25 DIAGNOSIS — O34211 Maternal care for low transverse scar from previous cesarean delivery: Secondary | ICD-10-CM | POA: Diagnosis present

## 2016-11-25 DIAGNOSIS — Z98891 History of uterine scar from previous surgery: Secondary | ICD-10-CM

## 2016-11-25 LAB — CBC
HEMATOCRIT: 30.6 % — AB (ref 36.0–46.0)
Hemoglobin: 10.6 g/dL — ABNORMAL LOW (ref 12.0–15.0)
MCH: 32.7 pg (ref 26.0–34.0)
MCHC: 34.6 g/dL (ref 30.0–36.0)
MCV: 94.4 fL (ref 78.0–100.0)
PLATELETS: 131 10*3/uL — AB (ref 150–400)
RBC: 3.24 MIL/uL — AB (ref 3.87–5.11)
RDW: 13.3 % (ref 11.5–15.5)
WBC: 8.6 10*3/uL (ref 4.0–10.5)

## 2016-11-25 LAB — RPR: RPR Ser Ql: NONREACTIVE

## 2016-11-25 SURGERY — Surgical Case
Anesthesia: Monitor Anesthesia Care | Wound class: Clean Contaminated

## 2016-11-25 MED ORDER — MENTHOL 3 MG MT LOZG: 1.0000 | LOZENGE | OROMUCOSAL | Status: DC | PRN

## 2016-11-25 MED ORDER — DIPHENHYDRAMINE HCL 50 MG/ML IJ SOLN: 12.5000 mg | INTRAMUSCULAR | Status: DC | PRN

## 2016-11-25 MED ORDER — LACTATED RINGERS IV SOLN
INTRAVENOUS | Status: DC
  Administered 2016-11-25 (×3): via INTRAVENOUS

## 2016-11-25 MED ORDER — SIMETHICONE 80 MG PO CHEW
80.0000 mg | CHEWABLE_TABLET | Freq: Three times a day (TID) | ORAL | Status: DC
  Administered 2016-11-26 – 2016-11-28 (×6): 80 mg via ORAL
  Filled 2016-11-25 (×6): qty 1

## 2016-11-25 MED ORDER — SIMETHICONE 80 MG PO CHEW: 80.0000 mg | CHEWABLE_TABLET | ORAL | Status: DC | PRN

## 2016-11-25 MED ORDER — OXYTOCIN 10 UNIT/ML IJ SOLN
INTRAMUSCULAR | Status: AC
  Filled 2016-11-25: qty 4

## 2016-11-25 MED ORDER — SENNOSIDES-DOCUSATE SODIUM 8.6-50 MG PO TABS
2.0000 | ORAL_TABLET | ORAL | Status: DC
  Administered 2016-11-26 – 2016-11-27 (×3): 2 via ORAL
  Filled 2016-11-25 (×3): qty 2

## 2016-11-25 MED ORDER — KETOROLAC TROMETHAMINE 30 MG/ML IJ SOLN: 30.0000 mg | Freq: Four times a day (QID) | INTRAMUSCULAR | Status: AC | PRN

## 2016-11-25 MED ORDER — NALOXONE HCL 0.4 MG/ML IJ SOLN: 0.4000 mg | INTRAMUSCULAR | Status: DC | PRN

## 2016-11-25 MED ORDER — METHYLERGONOVINE MALEATE 0.2 MG PO TABS: 0.2000 mg | ORAL_TABLET | ORAL | Status: DC | PRN

## 2016-11-25 MED ORDER — OXYTOCIN 40 UNITS IN LACTATED RINGERS INFUSION - SIMPLE MED
INTRAVENOUS | Status: DC | PRN
  Administered 2016-11-25: 40 [IU] via INTRAVENOUS

## 2016-11-25 MED ORDER — FERROUS SULFATE 325 (65 FE) MG PO TABS
325.0000 mg | ORAL_TABLET | Freq: Two times a day (BID) | ORAL | Status: DC
  Administered 2016-11-26 – 2016-11-28 (×5): 325 mg via ORAL
  Filled 2016-11-25 (×5): qty 1

## 2016-11-25 MED ORDER — DIBUCAINE 1 % RE OINT: 1.0000 "application " | TOPICAL_OINTMENT | RECTAL | Status: DC | PRN

## 2016-11-25 MED ORDER — METHYLERGONOVINE MALEATE 0.2 MG/ML IJ SOLN
INTRAMUSCULAR | Status: DC | PRN
  Administered 2016-11-25: 0.2 mg via INTRAMUSCULAR

## 2016-11-25 MED ORDER — OXYTOCIN 40 UNITS IN LACTATED RINGERS INFUSION - SIMPLE MED: 2.5000 [IU]/h | INTRAVENOUS | Status: AC

## 2016-11-25 MED ORDER — ONDANSETRON HCL 4 MG/2ML IJ SOLN: 4.0000 mg | Freq: Three times a day (TID) | INTRAMUSCULAR | Status: DC | PRN

## 2016-11-25 MED ORDER — NALBUPHINE HCL 10 MG/ML IJ SOLN: 5.0000 mg | Freq: Once | INTRAMUSCULAR | Status: DC | PRN

## 2016-11-25 MED ORDER — MEPERIDINE HCL 25 MG/ML IJ SOLN: 6.2500 mg | INTRAMUSCULAR | Status: DC | PRN

## 2016-11-25 MED ORDER — DIPHENHYDRAMINE HCL 25 MG PO CAPS
25.0000 mg | ORAL_CAPSULE | Freq: Four times a day (QID) | ORAL | Status: DC | PRN
  Filled 2016-11-25: qty 1

## 2016-11-25 MED ORDER — PHENYLEPHRINE 8 MG IN D5W 100 ML (0.08MG/ML) PREMIX OPTIME
INJECTION | INTRAVENOUS | Status: DC | PRN
  Administered 2016-11-25: 60 ug/min via INTRAVENOUS

## 2016-11-25 MED ORDER — NALBUPHINE HCL 10 MG/ML IJ SOLN: 5.0000 mg | INTRAMUSCULAR | Status: DC | PRN

## 2016-11-25 MED ORDER — WITCH HAZEL-GLYCERIN EX PADS: 1.0000 "application " | MEDICATED_PAD | CUTANEOUS | Status: DC | PRN

## 2016-11-25 MED ORDER — CEFAZOLIN SODIUM-DEXTROSE 2-4 GM/100ML-% IV SOLN
2.0000 g | INTRAVENOUS | Status: AC
  Administered 2016-11-25: 2 g via INTRAVENOUS
  Filled 2016-11-25: qty 100

## 2016-11-25 MED ORDER — LACTATED RINGERS IV SOLN
INTRAVENOUS | Status: DC | PRN
  Administered 2016-11-25: 11:00:00 via INTRAVENOUS

## 2016-11-25 MED ORDER — HYDROMORPHONE HCL 1 MG/ML IJ SOLN: 0.2500 mg | INTRAMUSCULAR | Status: DC | PRN

## 2016-11-25 MED ORDER — PRENATAL MULTIVITAMIN CH
1.0000 | ORAL_TABLET | Freq: Every day | ORAL | Status: DC
  Administered 2016-11-26 – 2016-11-28 (×3): 1 via ORAL
  Filled 2016-11-25 (×3): qty 1

## 2016-11-25 MED ORDER — ONDANSETRON HCL 4 MG/2ML IJ SOLN
INTRAMUSCULAR | Status: DC | PRN
  Administered 2016-11-25: 4 mg via INTRAVENOUS

## 2016-11-25 MED ORDER — FENTANYL CITRATE (PF) 100 MCG/2ML IJ SOLN
INTRAMUSCULAR | Status: DC | PRN
  Administered 2016-11-25: 10 ug via INTRATHECAL

## 2016-11-25 MED ORDER — METHYLERGONOVINE MALEATE 0.2 MG/ML IJ SOLN: 0.2000 mg | INTRAMUSCULAR | Status: DC | PRN

## 2016-11-25 MED ORDER — ONDANSETRON HCL 4 MG/2ML IJ SOLN
INTRAMUSCULAR | Status: AC
  Filled 2016-11-25: qty 2

## 2016-11-25 MED ORDER — NALOXONE HCL 2 MG/2ML IJ SOSY
1.0000 ug/kg/h | PREFILLED_SYRINGE | INTRAVENOUS | Status: DC | PRN
  Filled 2016-11-25: qty 2

## 2016-11-25 MED ORDER — PHENYLEPHRINE 40 MCG/ML (10ML) SYRINGE FOR IV PUSH (FOR BLOOD PRESSURE SUPPORT)
PREFILLED_SYRINGE | INTRAVENOUS | Status: AC
  Filled 2016-11-25: qty 10

## 2016-11-25 MED ORDER — SODIUM CHLORIDE 0.9 % IR SOLN
Status: DC | PRN
  Administered 2016-11-25: 1000 mL

## 2016-11-25 MED ORDER — METHYLERGONOVINE MALEATE 0.2 MG/ML IJ SOLN
INTRAMUSCULAR | Status: AC
  Filled 2016-11-25: qty 1

## 2016-11-25 MED ORDER — DIPHENHYDRAMINE HCL 25 MG PO CAPS
25.0000 mg | ORAL_CAPSULE | ORAL | Status: DC | PRN
  Administered 2016-11-26: 25 mg via ORAL

## 2016-11-25 MED ORDER — OXYCODONE-ACETAMINOPHEN 5-325 MG PO TABS: 1.0000 | ORAL_TABLET | ORAL | Status: DC | PRN

## 2016-11-25 MED ORDER — SIMETHICONE 80 MG PO CHEW
80.0000 mg | CHEWABLE_TABLET | ORAL | Status: DC
  Administered 2016-11-26 – 2016-11-27 (×3): 80 mg via ORAL
  Filled 2016-11-25 (×3): qty 1

## 2016-11-25 MED ORDER — LORATADINE 10 MG PO TABS
10.0000 mg | ORAL_TABLET | Freq: Every day | ORAL | Status: DC
  Administered 2016-11-28: 10 mg via ORAL
  Filled 2016-11-25 (×3): qty 1

## 2016-11-25 MED ORDER — OXYCODONE-ACETAMINOPHEN 5-325 MG PO TABS: 2.0000 | ORAL_TABLET | ORAL | Status: DC | PRN

## 2016-11-25 MED ORDER — IBUPROFEN 600 MG PO TABS
600.0000 mg | ORAL_TABLET | Freq: Four times a day (QID) | ORAL | Status: DC
  Administered 2016-11-26 – 2016-11-28 (×11): 600 mg via ORAL
  Filled 2016-11-25 (×11): qty 1

## 2016-11-25 MED ORDER — MORPHINE SULFATE (PF) 0.5 MG/ML IJ SOLN
INTRAMUSCULAR | Status: DC | PRN
  Administered 2016-11-25: .2 mg via INTRATHECAL

## 2016-11-25 MED ORDER — COCONUT OIL OIL: 1.0000 "application " | TOPICAL_OIL | Status: DC | PRN

## 2016-11-25 MED ORDER — MORPHINE SULFATE (PF) 0.5 MG/ML IJ SOLN
INTRAMUSCULAR | Status: AC
  Filled 2016-11-25: qty 10

## 2016-11-25 MED ORDER — FENTANYL CITRATE (PF) 100 MCG/2ML IJ SOLN
INTRAMUSCULAR | Status: AC
  Filled 2016-11-25: qty 2

## 2016-11-25 MED ORDER — PROMETHAZINE HCL 25 MG/ML IJ SOLN: 6.2500 mg | INTRAMUSCULAR | Status: DC | PRN

## 2016-11-25 MED ORDER — BUPIVACAINE IN DEXTROSE 0.75-8.25 % IT SOLN
INTRATHECAL | Status: DC | PRN
  Administered 2016-11-25: 1.2 mL via INTRATHECAL

## 2016-11-25 MED ORDER — ACETAMINOPHEN 325 MG PO TABS
650.0000 mg | ORAL_TABLET | ORAL | Status: DC | PRN
  Administered 2016-11-27 (×2): 650 mg via ORAL
  Filled 2016-11-25 (×2): qty 2

## 2016-11-25 MED ORDER — SCOPOLAMINE 1 MG/3DAYS TD PT72
1.0000 | MEDICATED_PATCH | Freq: Once | TRANSDERMAL | Status: AC
  Administered 2016-11-25: 1.5 mg via TRANSDERMAL
  Filled 2016-11-25: qty 1

## 2016-11-25 MED ORDER — SOD CITRATE-CITRIC ACID 500-334 MG/5ML PO SOLN
30.0000 mL | ORAL | Status: AC
Start: 2016-11-25 — End: 2016-11-25
  Administered 2016-11-25: 30 mL via ORAL
  Filled 2016-11-25: qty 15

## 2016-11-25 MED ORDER — SODIUM CHLORIDE 0.9% FLUSH: 3.0000 mL | INTRAVENOUS | Status: DC | PRN

## 2016-11-25 MED ORDER — LACTATED RINGERS IV SOLN
INTRAVENOUS | Status: DC
  Administered 2016-11-25: 125 mL/h via INTRAVENOUS
  Administered 2016-11-26: 02:00:00 via INTRAVENOUS

## 2016-11-25 MED ORDER — ZOLPIDEM TARTRATE 5 MG PO TABS: 5.0000 mg | ORAL_TABLET | Freq: Every evening | ORAL | Status: DC | PRN

## 2016-11-25 SURGICAL SUPPLY — 40 items
BARRIER ADHS 3X4 INTERCEED (GAUZE/BANDAGES/DRESSINGS) ×3 IMPLANT
BENZOIN TINCTURE PRP APPL 2/3 (GAUZE/BANDAGES/DRESSINGS) ×3 IMPLANT
CHLORAPREP W/TINT 26ML (MISCELLANEOUS) ×3 IMPLANT
CLAMP CORD UMBIL (MISCELLANEOUS) IMPLANT
CLOSURE WOUND 1/2 X4 (GAUZE/BANDAGES/DRESSINGS) ×1
CLOTH BEACON ORANGE TIMEOUT ST (SAFETY) ×3 IMPLANT
CONTAINER PREFILL 10% NBF 15ML (MISCELLANEOUS) IMPLANT
DERMABOND ADVANCED (GAUZE/BANDAGES/DRESSINGS) ×4
DERMABOND ADVANCED .7 DNX12 (GAUZE/BANDAGES/DRESSINGS) ×2 IMPLANT
DRSG OPSITE POSTOP 4X10 (GAUZE/BANDAGES/DRESSINGS) ×3 IMPLANT
ELECT REM PT RETURN 9FT ADLT (ELECTROSURGICAL) ×3
ELECTRODE REM PT RTRN 9FT ADLT (ELECTROSURGICAL) ×1 IMPLANT
EXTRACTOR VACUUM KIWI (MISCELLANEOUS) IMPLANT
GLOVE BIOGEL M 6.5 STRL (GLOVE) ×6 IMPLANT
GLOVE BIOGEL PI IND STRL 6.5 (GLOVE) ×1 IMPLANT
GLOVE BIOGEL PI IND STRL 7.0 (GLOVE) ×1 IMPLANT
GLOVE BIOGEL PI INDICATOR 6.5 (GLOVE) ×2
GLOVE BIOGEL PI INDICATOR 7.0 (GLOVE) ×2
GOWN STRL REUS W/TWL LRG LVL3 (GOWN DISPOSABLE) ×9 IMPLANT
KIT ABG SYR 3ML LUER SLIP (SYRINGE) IMPLANT
NEEDLE HYPO 25X5/8 SAFETYGLIDE (NEEDLE) IMPLANT
NS IRRIG 1000ML POUR BTL (IV SOLUTION) ×3 IMPLANT
PACK C SECTION WH (CUSTOM PROCEDURE TRAY) ×3 IMPLANT
PAD ABD 7.5X8 STRL (GAUZE/BANDAGES/DRESSINGS) ×3 IMPLANT
PAD OB MATERNITY 4.3X12.25 (PERSONAL CARE ITEMS) ×3 IMPLANT
PENCIL SMOKE EVAC W/HOLSTER (ELECTROSURGICAL) ×3 IMPLANT
RTRCTR C-SECT PINK 25CM LRG (MISCELLANEOUS) IMPLANT
SPONGE GAUZE 4X4 12PLY STER LF (GAUZE/BANDAGES/DRESSINGS) ×6 IMPLANT
STRIP CLOSURE SKIN 1/2X4 (GAUZE/BANDAGES/DRESSINGS) ×2 IMPLANT
SUT PDS AB 0 CT1 27 (SUTURE) ×6 IMPLANT
SUT PLAIN 0 NONE (SUTURE) IMPLANT
SUT VIC AB 0 CTX 36 (SUTURE) ×6
SUT VIC AB 0 CTX36XBRD ANBCTRL (SUTURE) ×3 IMPLANT
SUT VIC AB 2-0 CT1 27 (SUTURE) ×2
SUT VIC AB 2-0 CT1 TAPERPNT 27 (SUTURE) ×1 IMPLANT
SUT VIC AB 3-0 SH 27 (SUTURE)
SUT VIC AB 3-0 SH 27X BRD (SUTURE) IMPLANT
SUT VIC AB 4-0 KS 27 (SUTURE) ×3 IMPLANT
TOWEL OR 17X24 6PK STRL BLUE (TOWEL DISPOSABLE) ×3 IMPLANT
TRAY FOLEY BAG SILVER LF 14FR (SET/KITS/TRAYS/PACK) ×3 IMPLANT

## 2016-11-25 NOTE — Lactation Note (Signed)
This note was copied from a baby's chart. Lactation Consultation Note  Patient Name: Megan Johnson ZOXWR'UToday's Date: 11/25/2016 Reason for consult: Initial assessment   Initial assessment with Exp BF mom of 1 hour old infant in PACU. Mom reports she BF her 2.5 yo for 18 months. Her 7018 month old was born at 6136 weeks. Mom plans to exclusively BF for at least a year.  Infant awake and alert and cueing to feed. Assisted mom to latch infant to right breast in the laid back cross cradle hold. Infant latched immediately and fed for 15 minutes with gulping noted. Infant then latched to right breast in the laid back cross cradle hold for another 10 minutes. Infant left STS with mom.   Mom with soft compressible breasts and areola and everted nipples. Showed mom how to hand express and colostrum easily expressible. BF basics, cluster feeding, positioning, hand expression, STS, massaging/compressing breast with feedings, colostrum, milk coming to volume and NB nutritional needs reviewed. Enc mom to feed infant STS 8-12 x in 24 hours at first feeding cues. Enc mom to call out for feeding assistance as needed. Feeding log given with instructions for use.  BF Resources handout and LC Brochure given, mom informed of IP/OP Services, BF Support Groups and LC phone #. Mom has a Medela PIS at home.         Maternal Data Formula Feeding for Exclusion: No Has patient been taught Hand Expression?: Yes Does the patient have breastfeeding experience prior to this delivery?: Yes  Feeding Feeding Type: Breast Fed Length of feed: 25 min  LATCH Score/Interventions Latch: Grasps breast easily, tongue down, lips flanged, rhythmical sucking.  Audible Swallowing: Spontaneous and intermittent  Type of Nipple: Everted at rest and after stimulation  Comfort (Breast/Nipple): Soft / non-tender     Hold (Positioning): Assistance needed to correctly position infant at breast and maintain  latch. Intervention(s): Breastfeeding basics reviewed;Support Pillows;Position options;Skin to skin  LATCH Score: 9  Lactation Tools Discussed/Used WIC Program: No   Consult Status Consult Status: Follow-up Date: 11/26/16 Follow-up type: In-patient    Silas FloodSharon S Mayukha Symmonds 11/25/2016, 12:41 PM

## 2016-11-25 NOTE — Anesthesia Postprocedure Evaluation (Addendum)
Anesthesia Post Note  Patient: Megan Johnson  Procedure(s) Performed: Procedure(s) (LRB): CESAREAN SECTION (N/A)  Patient location during evaluation: Mother Baby Anesthesia Type: MAC Level of consciousness: oriented and awake and alert Pain management: pain level controlled Vital Signs Assessment: post-procedure vital signs reviewed and stable Respiratory status: spontaneous breathing, respiratory function stable and patient connected to nasal cannula oxygen Cardiovascular status: blood pressure returned to baseline and stable Postop Assessment: no headache and no backache Anesthetic complications: no        Last Vitals:  Vitals:   11/25/16 1603 11/25/16 1804  BP:    Pulse: 60   Resp:    Temp:  37.4 C    Last Pain:  Vitals:   11/25/16 1804  TempSrc: Oral  PainSc:    Pain Goal:                 [employer]

## 2016-11-25 NOTE — Anesthesia Preprocedure Evaluation (Addendum)
Anesthesia Evaluation  Patient identified by MRN, date of birth, ID band Patient awake    Reviewed: Allergy & Precautions, NPO status , Patient's Chart, lab work & pertinent test results  History of Anesthesia Complications Negative for: history of anesthetic complications  Airway Mallampati: II  TM Distance: >3 FB Neck ROM: Full    Dental no notable dental hx. (+) Dental Advisory Given   Pulmonary neg pulmonary ROS,    Pulmonary exam normal        Cardiovascular hypertension, Normal cardiovascular exam     Neuro/Psych negative neurological ROS     GI/Hepatic negative GI ROS, Neg liver ROS,   Endo/Other  negative endocrine ROS  Renal/GU negative Renal ROS     Musculoskeletal negative musculoskeletal ROS (+)   Abdominal   Peds  Hematology negative hematology ROS (+)   Anesthesia Other Findings Day of surgery medications reviewed with the patient.  Reproductive/Obstetrics                            Anesthesia Physical Anesthesia Plan  ASA: II  Anesthesia Plan: MAC and Spinal   Post-op Pain Management:    Induction:   Airway Management Planned: Natural Airway and Simple Face Mask  Additional Equipment:   Intra-op Plan:   Post-operative Plan: Extubation in OR  Informed Consent: I have reviewed the patients History and Physical, chart, labs and discussed the procedure including the risks, benefits and alternatives for the proposed anesthesia with the patient or authorized representative who has indicated his/her understanding and acceptance.   Dental advisory given  Plan Discussed with: CRNA, Anesthesiologist and Surgeon  Anesthesia Plan Comments:        Anesthesia Quick Evaluation

## 2016-11-25 NOTE — Anesthesia Postprocedure Evaluation (Signed)
Anesthesia Post Note  Patient: Megan Johnson  Procedure(s) Performed: Procedure(s) (LRB): CESAREAN SECTION (N/A)  Patient location during evaluation: PACU Anesthesia Type: MAC and Spinal Level of consciousness: awake and alert Pain management: pain level controlled Vital Signs Assessment: post-procedure vital signs reviewed and stable Respiratory status: spontaneous breathing and respiratory function stable Cardiovascular status: blood pressure returned to baseline and stable Postop Assessment: spinal receding Anesthetic complications: no        Last Vitals:  Vitals:   11/25/16 1245 11/25/16 1300  BP: 103/64 99/68  Pulse: 62 64  Resp: 18 15  Temp:      Last Pain:  Vitals:   11/25/16 1230  TempSrc:   PainSc: 0-No pain   Pain Goal:                 Marlyss Cissell DANIEL

## 2016-11-25 NOTE — Progress Notes (Signed)
Dr Malen GauzeFoster notified that pt continues to require 2 Liters of oxygen to maintain sats above 92%.  Attempted to wean O2  But sats dropped into low 90's and upper 80s

## 2016-11-25 NOTE — Op Note (Signed)
Cesarean Section Procedure Note  Indications: previous uterine incision h/o cesarean section   Pre-operative Diagnosis: 39 week 0 day pregnancy.  Post-operative Diagnosis: same  Surgeon: Jessee AversOLE,Evangelene Vora J.   Assistants: None  Anesthesia: Spinal anesthesia  ASA Class: 2   Procedure Details   The patient was seen in the Holding Room. The risks, benefits, complications, treatment options, and expected outcomes were discussed with the patient.  The patient concurred with the proposed plan, giving informed consent.  The site of surgery properly noted/marked. The patient was taken to Operating Room # 9, identified as Megan Johnson and the procedure verified as C-Section Delivery. A Time Out was held and the above information confirmed.  After induction of anesthesia, the patient was draped and prepped in the usual sterile manner. A Pfannenstiel incision was made and carried down through the subcutaneous tissue to the fascia. Fascial incision was made and extended transversely. The fascia was separated from the underlying rectus tissue superiorly and inferiorly. The peritoneum was identified and entered. Peritoneal incision was extended longitudinally. The utero-vesical peritoneal reflection was incised transversely and the bladder flap was bluntly freed from the lower uterine segment. A low transverse uterine incision was made. Delivered from cephalic presentation was a  Female with Apgar scores of 9 at one minute and 9 at five minutes. After the umbilical cord was clamped and cut cord blood was obtained for evaluation. The placenta was removed intact and appeared normal. The uterine outline, tubes and ovaries appeared normal. The uterine incision was closed with running locked sutures of 0 vicryl. A second layer of 0 vicryl was used to imbricate the incision. Pt was noted to have lower uterine segment atony. Methergine 0.2 mg was injected iinto the fundus of the uterus. Atony resolved. .  Hemostasis was observed. Lavage was carried out until clear. Interseed was placed along the uterine incision. The fascia was then reapproximated with running sutures of 0 pds. The skin was reapproximated with 4-0 vicryl .  Instrument, sponge, and needle counts were correct prior the abdominal closure and at the conclusion of the case.   Findings: Female infant cephalic presentation. Normal fallopian tubes and ovaries   Estimated Blood Loss:  800 mL         Drains: None         Total IV Fluids:  Per anesthesia ml         Specimens: Placenta sent to labor and delivery           Implants: none         Complications:  None; patient tolerated the procedure well.         Disposition: PACU - hemodynamically stable.         Condition: stable  Attending Attestation: I performed the procedure.

## 2016-11-25 NOTE — H&P (Signed)
Megan Johnson is a 31 y.o. female presenting for repeat cesarean section at 39 wks and 0 days. EDD 12/02/2016.  Prenatal care provided by Dr. Gerald Leitzara [place] with [facility]. Pregnancy complicated by h/o cesarean section. Pt presented to office yesterday with 3+ proteinuria. BP normal she reports headaches that are intermittent for the last 4 days.    OB History    Gravida Para Term Preterm AB Living   3 1   1 1 1    SAB TAB Ectopic Multiple Live Births   1       1     Past Medical History:  Diagnosis Date  . Medical history non-contributory   . Pregnancy induced hypertension    Past Surgical History:  Procedure Laterality Date  . CESAREAN SECTION N/A 05/24/2014   Procedure: CESAREAN SECTION;  Surgeon: Konrad FelixEma Wakuru Kulwa, MD;  Location: [facility];  Service: Obstetrics;  Laterality: N/A;  . WISDOM TOOTH EXTRACTION     Family History: family history is not on file. Social History:  reports that she has never smoked. She has never used smokeless tobacco. She reports that she does not drink alcohol or use drugs.     Maternal Diabetes: No Genetic Screening: Normal Maternal Ultrasounds/Referrals: Normal Fetal Ultrasounds or other Referrals:  None Maternal Substance Abuse:  No Significant Maternal Medications:  None Significant Maternal Lab Results:  Lab values include: Group B Strep negative Other Comments:  None  Review of Systems  Constitutional: Negative.   HENT: Negative.   Eyes: Negative.   Respiratory: Negative.   Cardiovascular: Positive for leg swelling.  Gastrointestinal: Negative.   Genitourinary: Negative.   Musculoskeletal: Negative.   Skin: Negative.   Neurological: Positive for headaches.  Endo/Heme/Allergies: Negative.   Psychiatric/Behavioral: Negative.    History   unknown if currently breastfeeding. Exam Physical Exam  Vitals reviewed. Constitutional: She is oriented to person, place, and time. She appears well-developed and well-nourished.  HENT:   Head: Normocephalic and atraumatic.  Eyes: Conjunctivae are normal. Pupils are equal, round, and reactive to light.  Neck: Normal range of motion. Neck supple.  Cardiovascular: Normal rate and regular rhythm.   Respiratory: Effort normal and breath sounds normal.  GI: There is no tenderness.  Genitourinary: Vagina normal.  Musculoskeletal: Normal range of motion. She exhibits edema.  Neurological: She is alert and oriented to person, place, and time. She displays abnormal reflex.  Skin: Skin is warm and dry.  Psychiatric: She has a normal mood and affect.    Prenatal labs: ABO, Rh: --/--/O POS (03/27 1236) Antibody: NEG (03/27 1236) Rubella:  Immune  RPR: Non Reactive (03/27 1240)  HBsAg:   Negative  HIV:   Nonreactive  GBS:   Negative   Assessment/Plan: 39 wks and 0 days with h/o cesarean section. Proteinuria affecting pregnancy 3+ Recommend repeat cesarean section given unfavorable cervix. R/B/A of cesarean section discussed with the patient including but not limited to infection, bleeding, damage to bowel bladder baby with the need for further surgery. Pt voiced understanding and desires to proceed with repeat cesarean section.   Megan Johnson J. 11/25/2016, 9:22 AM

## 2016-11-25 NOTE — Anesthesia Procedure Notes (Signed)
Spinal  Patient location during procedure: OR Start time: 11/25/2016 10:35 AM End time: 11/25/2016 10:39 AM Staffing Anesthesiologist: Heather RobertsSINGER, JAMES Performed: anesthesiologist  Preanesthetic Checklist Completed: patient identified, site marked, surgical consent, pre-op evaluation, timeout performed, IV checked, risks and benefits discussed and monitors and equipment checked Spinal Block Patient position: sitting Prep: ChloraPrep Patient monitoring: heart rate, cardiac monitor, continuous pulse ox and blood pressure Approach: midline Location: L3-4 Injection technique: single-shot Needle Needle type: Pencan  Needle gauge: 24 G Needle length: 10 cm Assessment Sensory level: T4

## 2016-11-25 NOTE — Addendum Note (Signed)
Addendum  created 11/25/16 1921 by Orlie Pollenebra R Persais Ethridge, CRNA   Sign clinical note

## 2016-11-25 NOTE — Transfer of Care (Signed)
Immediate Anesthesia Transfer of Care Note  Patient: Megan Johnson  Procedure(s) Performed: Procedure(s): CESAREAN SECTION (N/A)  Patient Location: PACU  Anesthesia Type:Spinal  Level of Consciousness: awake, alert , oriented and patient cooperative  Airway & Oxygen Therapy: Patient Spontanous Breathing  Post-op Assessment: Report given to RN and Post -op Vital signs reviewed and stable  Post vital signs: Reviewed and stable  Last Vitals:  Vitals:   11/25/16 0932  BP: 116/69  Pulse: 76  Resp: 16  Temp: 36.9 C    Last Pain:  Vitals:   11/25/16 0933  TempSrc:   PainSc: 0-No pain         Complications: No apparent anesthesia complications

## 2016-11-25 NOTE — Progress Notes (Signed)
Dr Richardson Doppole notified that pt started on 2 Liter oxygen via nasal cannula

## 2016-11-26 ENCOUNTER — Inpatient Hospital Stay (HOSPITAL_COMMUNITY): Payer: 59

## 2016-11-26 LAB — CBC WITH DIFFERENTIAL/PLATELET
BASOS ABS: 0 10*3/uL (ref 0.0–0.1)
Basophils Relative: 0 %
EOS ABS: 0.1 10*3/uL (ref 0.0–0.7)
EOS PCT: 1 %
HCT: 29.5 % — ABNORMAL LOW (ref 36.0–46.0)
Hemoglobin: 10.1 g/dL — ABNORMAL LOW (ref 12.0–15.0)
Lymphocytes Relative: 15 %
Lymphs Abs: 1.6 10*3/uL (ref 0.7–4.0)
MCH: 32.4 pg (ref 26.0–34.0)
MCHC: 34.2 g/dL (ref 30.0–36.0)
MCV: 94.6 fL (ref 78.0–100.0)
Monocytes Absolute: 0.4 10*3/uL (ref 0.1–1.0)
Monocytes Relative: 4 %
NEUTROS PCT: 80 %
Neutro Abs: 8.3 10*3/uL — ABNORMAL HIGH (ref 1.7–7.7)
PLATELETS: 137 10*3/uL — AB (ref 150–400)
RBC: 3.12 MIL/uL — AB (ref 3.87–5.11)
RDW: 13.6 % (ref 11.5–15.5)
WBC: 10.4 10*3/uL (ref 4.0–10.5)

## 2016-11-26 LAB — COMPREHENSIVE METABOLIC PANEL
ALBUMIN: 2.5 g/dL — AB (ref 3.5–5.0)
ALT: 14 U/L (ref 14–54)
AST: 33 U/L (ref 15–41)
Alkaline Phosphatase: 80 U/L (ref 38–126)
Anion gap: 10 (ref 5–15)
BUN: 8 mg/dL (ref 6–20)
CHLORIDE: 103 mmol/L (ref 101–111)
CO2: 21 mmol/L — AB (ref 22–32)
CREATININE: 0.52 mg/dL (ref 0.44–1.00)
Calcium: 8.1 mg/dL — ABNORMAL LOW (ref 8.9–10.3)
GFR calc Af Amer: >60 mL/min (ref >60)
GFR calc non Af Amer: >60 mL/min (ref >60)
GLUCOSE: 93 mg/dL (ref 65–99)
Potassium: 3.9 mmol/L (ref 3.5–5.1)
SODIUM: 134 mmol/L — AB (ref 135–145)
Total Bilirubin: 0.5 mg/dL (ref 0.3–1.2)
Total Protein: 5.6 g/dL — ABNORMAL LOW (ref 6.5–8.1)

## 2016-11-26 LAB — CBC
HCT: 28.3 % — ABNORMAL LOW (ref 36.0–46.0)
Hemoglobin: 9.8 g/dL — ABNORMAL LOW (ref 12.0–15.0)
MCH: 32.7 pg (ref 26.0–34.0)
MCHC: 34.6 g/dL (ref 30.0–36.0)
MCV: 94.3 fL (ref 78.0–100.0)
Platelets: 119 10*3/uL — ABNORMAL LOW (ref 150–400)
RBC: 3 MIL/uL — ABNORMAL LOW (ref 3.87–5.11)
RDW: 13.5 % (ref 11.5–15.5)
WBC: 9.4 10*3/uL (ref 4.0–10.5)

## 2016-11-26 LAB — BIRTH TISSUE RECOVERY COLLECTION (PLACENTA DONATION)

## 2016-11-26 MED ORDER — FUROSEMIDE 10 MG/ML IJ SOLN
40.0000 mg | Freq: Once | INTRAMUSCULAR | Status: AC
  Administered 2016-11-26: 40 mg via INTRAVENOUS
  Filled 2016-11-26: qty 4

## 2016-11-26 MED ORDER — IOPAMIDOL (ISOVUE-370) INJECTION 76%
100.0000 mL | Freq: Once | INTRAVENOUS | Status: AC | PRN
  Administered 2016-11-26: 100 mL via INTRAVENOUS

## 2016-11-26 NOTE — Progress Notes (Signed)
Pt taken to CT scan via of wheelchair

## 2016-11-26 NOTE — Progress Notes (Signed)
O2 @2L  removed by NP this morning.  At 0920 readings where down to 75-88%. O2 reapplied and then removed after 20 minutes. Sats up to 97% on room air. periodically Sat dropped down to 80's.  Dr. Dion BodyVarnado notified and She stated she was going to come this afternoon to see patient.

## 2016-11-26 NOTE — Progress Notes (Signed)
Called by RN regarding sudden intermittent decrease in O2 saturation.  Per RN, pt denies SOB, chest pain at the time.  Can happen in any position at anytime.    Subjective. Pt denies SOB or chest pain.  She has not ambulated extensively but when she does she does not get SOB, dizzy or lightheaded.  Lochia is minimal.  BP 102/65 (BP Location: Right Arm)   Pulse 64   Temp 98.1 F (36.7 C) (Oral)   Resp 18   Ht 4\' 11"  (1.499 m)   Wt 75.8 kg (167 lb)   SpO2 93%   Breastfeeding? Unknown   BMI 33.73 kg/m   Gen:  NAD, Comfortable at a 45 degree angle.  Able to complete sentences. CV:  RRR Lungs:  CTA bilaterally, air movement noted.  No rhonchi or crackles,decreased lung sounds at base, improve with deep breath. Abdomen:  Distended as previous, nontender. Ext:  No calf tenderness or discoloration.  Mild edema.  CBC Latest Ref Rng & Units 11/26/2016 11/25/2016 11/24/2016  WBC 4.0 - 10.5 K/uL 9.4 8.6 8.4  Hemoglobin 12.0 - 15.0 g/dL 8.2(N9.8(L) 10.6(L) 11.7(L)  Hematocrit 36.0 - 46.0 % 28.3(L) 30.6(L) 34.1(L)  Platelets 150 - 400 K/uL 119(L) 131(L) 137(L)    A/P S/p repeat cesarean section.  Appears well clinically. O2 desaturation-  Concerns regarding PE but no other obvious symptoms.  Encourage Incentive spirometry.  Consider CT to rule out PE. Discussed with Dr. Jean RosenthalJackson, anesthesiologist, for recommendations.  He will come to evaluate pt, which is greatly appreciated.

## 2016-11-26 NOTE — Lactation Note (Signed)
This note was copied from a baby's chart. Lactation Consultation Note  Patient Name: Megan Johnson SettleYariam Cruz Del Pilar [id number]'s Date: 11/26/2016 Reason for consult: Follow-up assessment Baby at 34 hr of life. Upon entry mom was eating and baby was sleeping. Mom reports bf is going well. She denies breast or nipple pain. She voiced no concerns. Discussed baby behavior, feeding frequency, baby belly size, voids, wt loss, breast changes, and nipple care. Mom is aware of lactation services and support group.   Maternal Data    Feeding Feeding Type: Breast Fed Length of feed: 20 min  LATCH Score/Interventions                      Lactation Tools Discussed/Used     Consult Status Consult Status: Follow-up Date: 11/27/16 Follow-up type: In-patient    Rulon Eisenmengerlizabeth E Dorsey Charette 11/26/2016, 9:53 PM

## 2016-11-26 NOTE — Progress Notes (Signed)
Late entry.-  Subjective: Postop Day 1: Cesarean Delivery No complaints.  Pain controlled.  Lochia normal.  Breast feeding yes. Has not ambulated in halls yet.  Objective: Temp:  [98.1 F (36.7 C)-100.1 F (37.8 C)] 98.1 F (36.7 C) (03/29 0930) Pulse Rate:  [60-72] 64 (03/29 0930) Resp:  [18-20] 18 (03/29 0930) BP: (94-107)/(54-65) 102/65 (03/29 0930) SpO2:  [75 %-98 %] 93 % (03/29 1000)  Physical Exam: Gen: NAD Lochia: Not visualized Uterine Fundus: firm, appropriately tender Incision: clean, dry and intact, healing well DVT Evaluation: min Edema present, no calf tenderness bilaterally    Recent Labs  11/25/16 0939 11/26/16 0557  HGB 10.6* 9.8*  HCT 30.6* 28.3*    Assessment/Plan: Status post C-section-doing well postoperatively. Continue routine post op care. Encouraged ambulation in halls TID. Lactation support. Eligible for discharge in am.     Peggye Poon 11/26/2016, 1:40 PM

## 2016-11-26 NOTE — Progress Notes (Signed)
Dr Dion BodyVarnado called and orders reeived

## 2016-11-27 NOTE — Lactation Note (Signed)
This note was copied from a baby's chart. Lactation Consultation Note  Patient Name: Megan Johnson [id number] Date: 11/27/2016  Pecola Leisure is responding to double phototherapy.  This experienced breastfeeding mom states the baby is nursing frequently and well.  Output is good and stools are transitioning.  Due to good feedings and output it is not necessary to begin additional pumping.  Stressed importance of frequent feeds and using good breast massage during feeding.  Mom states breasts are feeling fuller.  No concerns at present.   Maternal Data    Feeding Feeding Type: Breast Fed Length of feed: 25 min  LATCH Score/Interventions Latch: Grasps breast easily, tongue down, lips flanged, rhythmical sucking. Intervention(s): Assist with latch;Adjust position  Audible Swallowing: A few with stimulation Intervention(s): Skin to skin  Type of Nipple: Everted at rest and after stimulation  Comfort (Breast/Nipple): Soft / non-tender     Hold (Positioning): Assistance needed to correctly position infant at breast and maintain latch.  LATCH Score: 8  Lactation Tools Discussed/Used     Consult Status      Huston Foley 11/27/2016, 11:17 AM

## 2016-11-27 NOTE — Progress Notes (Signed)
Megan Johnson [id number]  Subjective: Postpartum Day 2: Repeat C/S due to gestational HTN; proteinuria Patient up ad lib, reports no syncope or dizziness. Feeding:  Breast; bottle Contraceptive plan:  undecided  Objective: Temp:  [98.2 F (36.8 C)-98.6 F (37 C)] 98.2 F (36.8 C) (03/30 0513) Pulse Rate:  [67-75] 73 (03/30 0513) Resp:  [18] 18 (03/30 0513) BP: (107-109)/(57-69) 109/69 (03/30 0513) SpO2:  [94 %-97 %] 97 % (03/30 0901)  CBC Latest Ref Rng & Units 11/26/2016 11/26/2016 11/25/2016  WBC 4.0 - 10.5 K/uL 10.4 9.4 8.6  Hemoglobin 12.0 - 15.0 g/dL 10.1(L) 9.8(L) 10.6(L)  Hematocrit 36.0 - 46.0 % 29.5(L) 28.3(L) 30.6(L)  Platelets 150 - 400 K/uL 137(L) 119(L) 131(L)     Physical Exam:  General: alert, cooperative and appears stated age 31: appropriate Uterine Fundus: firm Abdomen:  + bowel sounds, pos Incision: Honeycomb dressing CDI DVT Evaluation: No evidence of DVT seen on physical exam.   Assessment/Plan: Status post cesarean delivery, day 2. Stable Continue current care. Plan for discharge tomorrow    Rhea Pink MSN, CNM 11/27/2016, 11:47 AM

## 2016-11-28 MED ORDER — IBUPROFEN 600 MG PO TABS: 600.0000 mg | ORAL_TABLET | Freq: Four times a day (QID) | ORAL | 0 refills | Status: AC

## 2016-11-28 MED ORDER — FERROUS SULFATE 325 (65 FE) MG PO TABS: 325.0000 mg | ORAL_TABLET | Freq: Two times a day (BID) | ORAL | 3 refills | Status: AC

## 2016-11-28 MED ORDER — OXYCODONE-ACETAMINOPHEN 5-325 MG PO TABS: 1.0000 | ORAL_TABLET | ORAL | 0 refills | Status: AC | PRN

## 2016-11-28 NOTE — Discharge Summary (Signed)
Cesarean Section Delivery Discharge Summary  Megan Johnson  DOB:    May 04, 1986 MRN:    [id number] CSN:    [id number]  Date of admission:                  11/25/16  Date of discharge:                   10/3116  Procedures this admission:  Date of Delivery: 11/25/16  Newborn Data:  Live born female  Birth Weight: 8 lb 8.2 oz (3860 g) APGAR: 9, 9   History of Present Illness:  Megan Johnson is a 31 y.o. female, [id number], who presents at [redacted]w[redacted]d weeks gestation. The patient has been followed at  [facility]   Her pregnancy has been complicated by: previous c/s  Patient Active Problem List   Diagnosis Date Noted  . Status post repeat low transverse cesarean section 11/25/2016  . Threatened miscarriage in early pregnancy 01/16/2016  . Hx of cesarean section 01/16/2016  . Hemorrhoid 03/01/2014    Hospital Course--Scheduled Cesarean: Patient was admitted on 11/25/16 for a scheduled repeat cesarean delivery.   She was taken to the operating room, where Dr. Richardson Dopp performed a repeat LTCS under spinal anesthesia, with delivery of a viable female, with weight and Apgars as listed below. Infant was in good condition and remained at the patient's bedside.  The patient was taken to recovery in good condition.  Patient planned to breast feed.  On post-op day 1, patient was doing well, tolerating a regular diet, with Hgb of 9.8. On day 2 she experienced decreased o2 Stas, (See imaging studies) below. Which have now resolved.  By post-op day 3, she was up ad lib, tolerating a regular diet, with good pain control with po med.  She was deemed to have received the full benefit of her hospital stay, and was discharged home in stable condition.  Contraceptive choice was undecided  Dg Chest 2 View  Result Date: 11/26/2016 CLINICAL DATA:  Oxygen desaturation EXAM: CHEST  2 VIEW COMPARISON:  None  FINDINGS: Mild enlargement of cardiac silhouette. Mediastinal contours and pulmonary vascularity normal. Small bibasilar pleural effusions and basilar atelectasis. Lungs otherwise clear. No pneumothorax. Bones unremarkable. IMPRESSION: Small bibasilar pleural effusions and basilar atelectasis. Mild enlargement of cardiac silhouette. Electronically Signed   By: Ulyses Southward M.D.   On: 11/26/2016 17:40   Ct Angio Chest Pe W Or Wo Contrast  Result Date: 11/26/2016 CLINICAL DATA:  Hypoxia after Caesarean section. EXAM: CT ANGIOGRAPHY CHEST WITH CONTRAST TECHNIQUE: Multidetector CT imaging of the chest was performed using the standard protocol during bolus administration of intravenous contrast. Multiplanar CT image reconstructions and MIPs were obtained to evaluate the vascular anatomy. CONTRAST:  100 cc Isovue 370 IV COMPARISON:  None. FINDINGS: Cardiovascular: Borderline cardiomegaly without pericardial effusion. No pulmonary embolus. No aortic aneurysm or dissection. Mediastinum/Nodes: No enlarged mediastinal, hilar, or axillary lymph nodes. Thyroid gland, trachea, and esophagus demonstrate no significant findings. Lungs/Pleura: There are small bilateral pleural effusions right slightly greater than left with adjacent atelectasis. No pneumonic consolidation or pneumothorax. Upper Abdomen: Nonacute.  No pneumoperitoneum identified. Musculoskeletal: No chest wall abnormality. No acute or significant osseous findings. Review of the MIP images confirms the above findings. IMPRESSION: 1. No pulmonary embolus.  Borderline cardiomegaly. 2. Small bilateral pleural effusions with adjacent atelectasis right greater than left. Electronically Signed   By:  Tollie Eth M.D.   On: 11/26/2016 20:24   Feeding:  breast  Contraception:  undecided  Hemoglobin Results:  CBC Latest Ref Rng & Units 11/26/2016 11/26/2016 11/25/2016  WBC 4.0 - 10.5 K/uL 10.4 9.4 8.6  Hemoglobin 12.0 - 15.0 g/dL 10.1(L) 9.8(L) 10.6(L)   Hematocrit 36.0 - 46.0 % 29.5(L) 28.3(L) 30.6(L)  Platelets 150 - 400 K/uL 137(L) 119(L) 131(L)     Discharge Physical Exam:   General: alert, cooperative and appears stated age 89: appropriate Uterine Fundus: firm Abdomen:  + bowel sounds, positive  Incision:honeycomb dressing with small amount of old drainage DVT Evaluation: No evidence of DVT seen on physical exam.  Intrapartum Procedures: cesarean: low cervical, transverse Postpartum Procedures: CT and Chest Xray Complications-Operative and Postpartum: decreased 02 sats  Discharge Diagnoses: Term Pregnancy-delivered  Discharge Information:  Activity:           pelvic rest Diet:                routine Medications: Ibuprofen, Iron and Percocet Condition:      stable Instructions:  Discharge to: home or room in if baby stays  Follow-up Information    [facility]. Schedule an appointment as soon as possible for a visit on 11/30/2016.   Specialty:  Obstetrics and Gynecology Why:  You will Need follow up appointment with your physician early next week Contact information: [address] [place] [address] [phone]            Elmore Guise Lamarco Gudiel CNM 11/28/2016 11:21 AM

## 2016-11-28 NOTE — Discharge Instructions (Signed)
Pulmonary Edema Pulmonary edema is abnormal fluid buildup in the lungs that can make it hard to breathe. Follow these instructions at home:  Talk to your doctor about an exercise program.  Eat a healthy diet:  Eat fresh fruits, vegetables, and lean meats.  Limit high fat and salty foods.  Avoid processed, canned, or fried foods.  Avoid fast food.  Follow your doctor's advice about taking medicine and recording the medicine you take.  Follow your doctor's advice about keeping a record of your weight.  Talk to your doctor about keeping track of your blood pressure.  Do not smoke.  Do not use nicotine patches or nicotine gum.  Make a follow-up appointment with your doctor.  Ask your doctor for a copy of your latest heart tracing (ECG) and keep a copy with you at all times. Get help right away if:  You have chest pain. THIS IS AN EMERGENCY. Do not wait to see if the pain will go away. Call for local emergency medical help. Do not drive yourself to the hospital.  You have sweating, feel sick to your stomach (nauseous), or are experiencing shortness of breath.  Your weight increases more than your doctor tells you it should.  You start to have shortness of breath.  You notice more swelling in your hands, feet, ankles, or belly.  You have dizziness, blurred vision, headache, or unsteadiness that does not go away.  You cough up bloody spit.  You have a cough that does not go away.  You are unable to sleep because it is hard to breathe.  You begin to feel a jumping or fluttering sensation (palpitations) in the chest that is unusual for you. This information is not intended to replace advice given to you by your health care provider. Make sure you discuss any questions you have with your health care provider. Document Released: 08/05/2009 Document Revised: 01/23/2016 Document Reviewed: 04/24/2013 Elsevier Interactive Patient Education  2017 [employer]  Postpartum  Care After Cesarean Delivery The period of time right after you deliver your newborn is called the postpartum period. What kind of medical care will I receive?  You may continue to receive fluids and medicines through an IV tube inserted into one of your veins.  You may have small, flexible tube (catheter) draining urine from your bladder into a bag outside of your body. The catheter will be removed as soon as possible.  You may be given a squirt bottle to use when you go to the bathroom. You may use this until you are comfortable wiping as usual. To use the squirt bottle, follow these steps:  Before you urinate, fill the squirt bottle with warm water. The water should be warm. Do not use hot water.  After you urinate, while you are sitting on the toilet, use the squirt bottle to rinse the area around your urethra and vaginal opening. This rinses away any urine and blood.  You may do this instead of wiping. As you start healing, you may use the squirt bottle before wiping yourself. Make sure to wipe gently.  Fill the squirt bottle with clean water every time you use the bathroom.  You will be given sanitary pads to wear.  Your incision will be monitored to make sure it is healing properly. You will be told when it is safe for your stitches, staples, or skin adhesive tape to be removed. What can I expect?  You may not feel the need to urinate for several hours  after delivery.  You will have some soreness and pain in your abdomen. You may have a small amount of blood or clear fluid coming from your incision.  If you are breastfeeding, you may have uterine contractions every time you breastfeed for up to several weeks postpartum. Uterine contractions help your uterus return to its normal size.  It is normal to have vaginal bleeding (lochia) after delivery. The amount and appearance of lochia is often similar to a menstrual period in the first week after delivery. It will gradually decrease  over the next few weeks to a dry, yellow-brown discharge. For most women, lochia stops completely by 6-8 weeks after delivery. Vaginal bleeding can vary from woman to woman.  Within the first few days after delivery, you may have breast engorgement. This is when your breasts feel heavy, full, and uncomfortable. Your breasts may also throb and feel hard, tightly stretched, warm, and tender. After this occurs, you may have milk leaking from your breasts.Your health care provider can help you relieve discomfort due to breast engorgement. Breast engorgement should go away within a few days.  You may feel more sad or worried than normal due to hormonal changes after delivery. These feelings should not last more than a few days. If these feelings do not go away after several days, speak with your health care provider. How should I care for myself?  Tell your health care provider if you have pain or discomfort.  Drink enough water to keep your urine clear or pale yellow.  Wash your hands thoroughly with soap and water for at least 20 seconds after changing your sanitary pads or using the toilet, and before holding or feeding your baby.  If you are not breastfeeding, avoid touching your breasts a lot. Doing this can make your breasts produce more milk.  If you become weak or lightheaded, or you feel like you might faint, ask for help before:  Getting out of bed.  Showering.  Change your sanitary pads frequently. Watch for any changes in your flow, such as a sudden increase in volume, a change in color, or the passing of large blood clots. If you pass a blood clot from your vagina, save it to show to your health care provider. Do not flush blood clots down the toilet without having your health care provider look at them.  Make sure that all your vaccinations are up to date. This can help protect you and your baby from getting certain diseases. You may need to have immunizations done before you leave  the hospital.  If desired, talk with your health care provider about methods of family planning or birth control (contraception). How can I start bonding with my baby? Spending as much time as possible with your baby is very important. During this time, you and your baby can get to know each other and develop a bond. Having your baby stay with you in your room (rooming in) can give you time to get to know your baby. Rooming in can also help you become comfortable caring for your baby. Breastfeeding can also help you bond with your baby. How can I plan for returning home with my baby?  Make sure that you have a car seat installed in your vehicle.  Your car seat should be checked by a certified car seat installer to make sure that it is installed safely.  Make sure that your baby fits into the car seat safely.  Ask your health care provider any  questions you have about caring for yourself or your baby. Make sure that you are able to contact your health care provider with any questions after leaving the hospital. This information is not intended to replace advice given to you by your health care provider. Make sure you discuss any questions you have with your health care provider. Document Released: 05/11/2012 Document Revised: 01/20/2016 Document Reviewed: 07/22/2015 Elsevier Interactive Patient Education  2017 [employer]  Home Care Instructions for Mom  ACTIVITY  Gradually return to your regular activities.  Let yourself rest. Nap while your baby sleeps.  Avoid lifting anything that is heavier than 10 lb (4.5 kg) until your health care provider says it is okay.  Avoid activities that take a lot of effort and energy (are strenuous) until approved by your health care provider. Walking at a slow-to-moderate pace is usually safe.  If you had a cesarean delivery:  Do not vacuum, climb stairs, or drive a car for 4-6 weeks.  Have someone help you at home until you feel like you can do  your usual activities yourself.  Do exercises as told by your health care provider, if this applies. VAGINAL BLEEDING You may continue to bleed for 4-6 weeks after delivery. Over time, the amount of blood usually decreases and the color of the blood usually gets lighter. However, the flow of bright red blood may increase if you have been too active. If you need to use more than one pad in an hour because your pad gets soaked, or if you pass a large clot:  Lie down.  Raise your feet.  Place a cold compress on your lower abdomen.  Rest.  Call your health care provider. If you are breastfeeding, your period should return anytime between 8 weeks after delivery and the time that you stop breastfeeding. If you are not breastfeeding, your period should return 6-8 weeks after delivery. PERINEAL CARE The perineal area, or perineum, is the part of your body between your thighs. After delivery, this area needs special care. Follow these instructions as told by your health care provider.  Take warm tub baths for 15-20 minutes.  Use medicated pads and pain-relieving sprays and creams as told.  Do not use tampons or douches until vaginal bleeding has stopped.  Each time you go to the bathroom:  Use a peri bottle.  Change your pad.  Use towelettes in place of toilet paper until your stitches have healed.  Do Kegel exercises every day. Kegel exercises help to maintain the muscles that support the vagina, bladder, and bowels. You can do these exercises while you are standing, sitting, or lying down. To do Kegel exercises:  Tighten the muscles of your abdomen and the muscles that surround your birth canal.  Hold for a few seconds.  Relax.  Repeat until you have done this 5 times in a row.  To prevent hemorrhoids from developing or getting worse:  Drink enough fluid to keep your urine clear or pale yellow.  Avoid straining when having a bowel movement.  Take over-the-counter medicines  and stool softeners as told by your health care provider. BREAST CARE  Wear a tight-fitting bra.  Avoid taking over-the-counter pain medicine for breast discomfort.  Apply ice to the breasts to help with discomfort as needed:  Put ice in a plastic bag.  Place a towel between your skin and the bag.  Leave the ice on for 20 minutes or as told by your health care provider. NUTRITION  Eat  a well-balanced diet.  Do not try to lose weight quickly by cutting back on calories.  Take your prenatal vitamins until your postpartum checkup or until your health care provider tells you to stop. POSTPARTUM DEPRESSION You may find yourself crying for no apparent reason and unable to cope with all of the changes that come with having a newborn. This mood is called postpartum depression. Postpartum depression happens because your hormone levels change after delivery. If you have postpartum depression, get support from your partner, friends, and family. If the depression does not go away on its own after several weeks, contact your health care provider. BREAST SELF-EXAM Do a breast self-exam each month, at the same time of the month. If you are breastfeeding, check your breasts just after a feeding, when your breasts are less full. If you are breastfeeding and your period has started, check your breasts on day 5, 6, or 7 of your period. Report any lumps, bumps, or discharge to your health care provider. Know that breasts are normally lumpy if you are breastfeeding. This is temporary, and it is not a health risk. INTIMACY AND SEXUALITY Avoid sexual activity for at least 3-4 weeks after delivery or until the brownish-red vaginal flow is completely gone. If you want to avoid pregnancy, use some form of birth control. You can get pregnant after delivery, even if you have not had your period. SEEK MEDICAL CARE IF:  You feel unable to cope with the changes that a child brings to your life, and these feelings do  not go away after several weeks.  You notice a lump, a bump, or discharge on your breast. SEEK IMMEDIATE MEDICAL CARE IF:  Blood soaks your pad in 1 hour or less.  You have:  Severe pain or cramping in your lower abdomen.  A bad-smelling vaginal discharge.  A fever that is not controlled by medicine.  A fever, and an area of your breast is red and sore.  Pain or redness in your calf.  Sudden, severe chest pain.  Shortness of breath.  Painful or bloody urination.  Problems with your vision.  You vomit for 12 hours or longer.  You develop a severe headache.  You have serious thoughts about hurting yourself, your child, or anyone else. This information is not intended to replace advice given to you by your health care provider. Make sure you discuss any questions you have with your health care provider. Document Released: 08/14/2000 Document Revised: 01/23/2016 Document Reviewed: 02/18/2015 Elsevier Interactive Patient Education  2017 [employer].

## 2016-11-28 NOTE — Lactation Note (Signed)
This note was copied from a baby's chart. Lactation Consultation Note; infant is at 7 % wt loss this am. Infant remains on double photo therapy. Mother is breastfeeding infant on cue. Mother reports that she breast are very full. Advised to feed infant wake infant good when feeding and feed well. She was given a hand pump and advised to post pump for 10 mins on each for comfort.  Mother began pumping with hand pump right away. She has good flow of ebm. Mother advised to give infant ebm with spoon or cup. Mother to call for [facility] when need to supplement infant with EBM. Encouraged mother to massage breast well and ice as needed to prevent severe engorgement. Mother receptive to all teaching.  Patient Name: Girl Ameenah Prosser [id number] Date: 11/28/2016     Maternal Data    Feeding Feeding Type: Breast Fed Length of feed: 15 min  LATCH Score/Interventions                      Lactation Tools Discussed/Used     Consult Status      Michel Bickers 11/28/2016, 11:22 AM

## 2017-01-30 ENCOUNTER — Encounter (HOSPITAL_COMMUNITY): Payer: Self-pay | Admitting: Obstetrics and Gynecology

## 2017-01-30 NOTE — Addendum Note (Signed)
Addendum  created 01/30/17 1020 by Onica Davidovich, MD   Sign clinical note    

## 2017-08-28 IMAGING — CR DG SHOULDER 2+V*R*
3 series · 3 of 3 positions shown · non-contrast
Comparison: None.

CLINICAL DATA: Patient with sharp pain to humeral head while at
work. Initial encounter.

EXAM:
RIGHT SHOULDER - 2+ VIEW

[w shoulder grashey right]
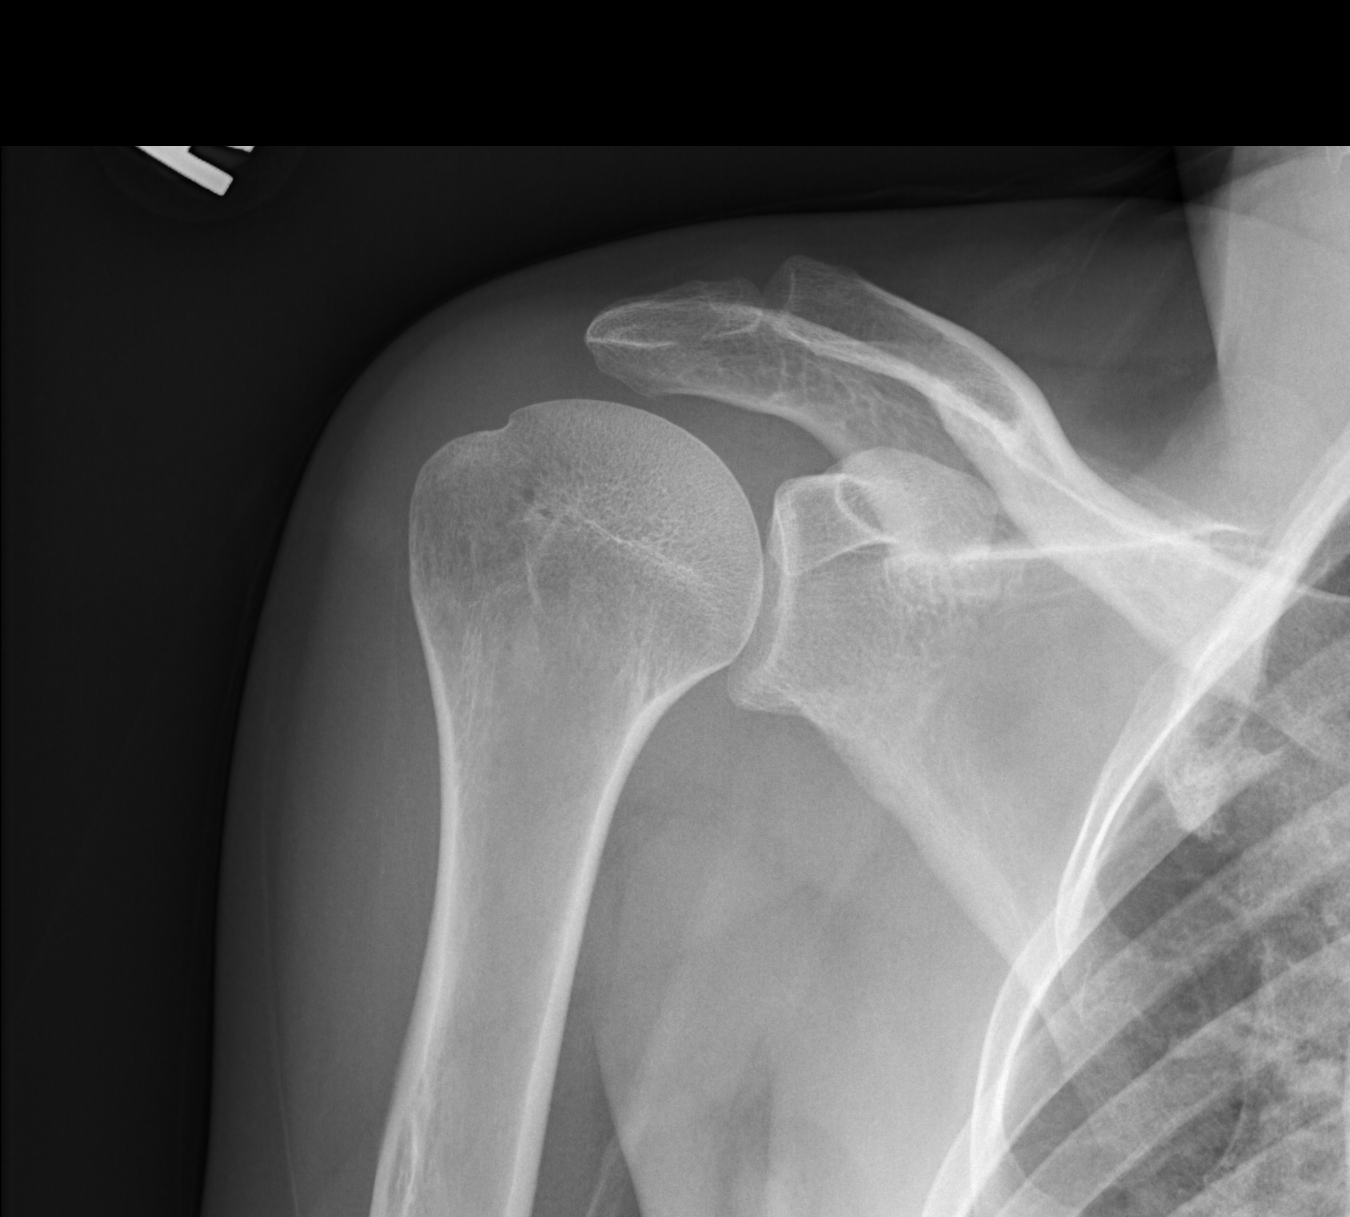

[w shoulder y view right]
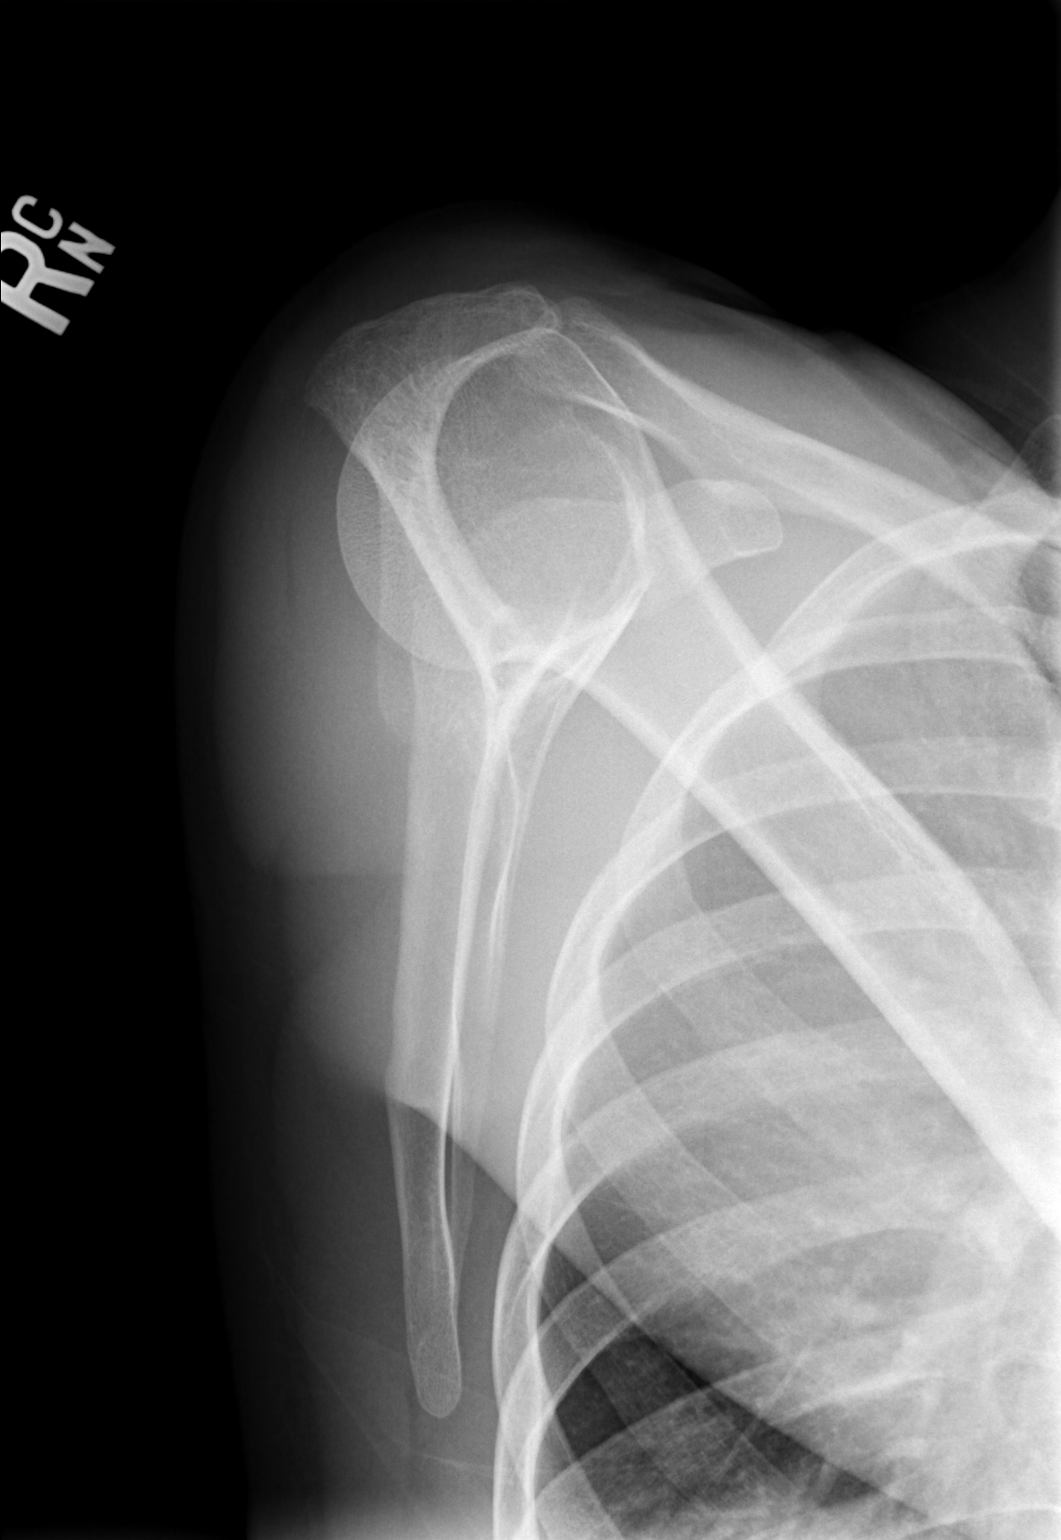

[x shoulder axillary right]
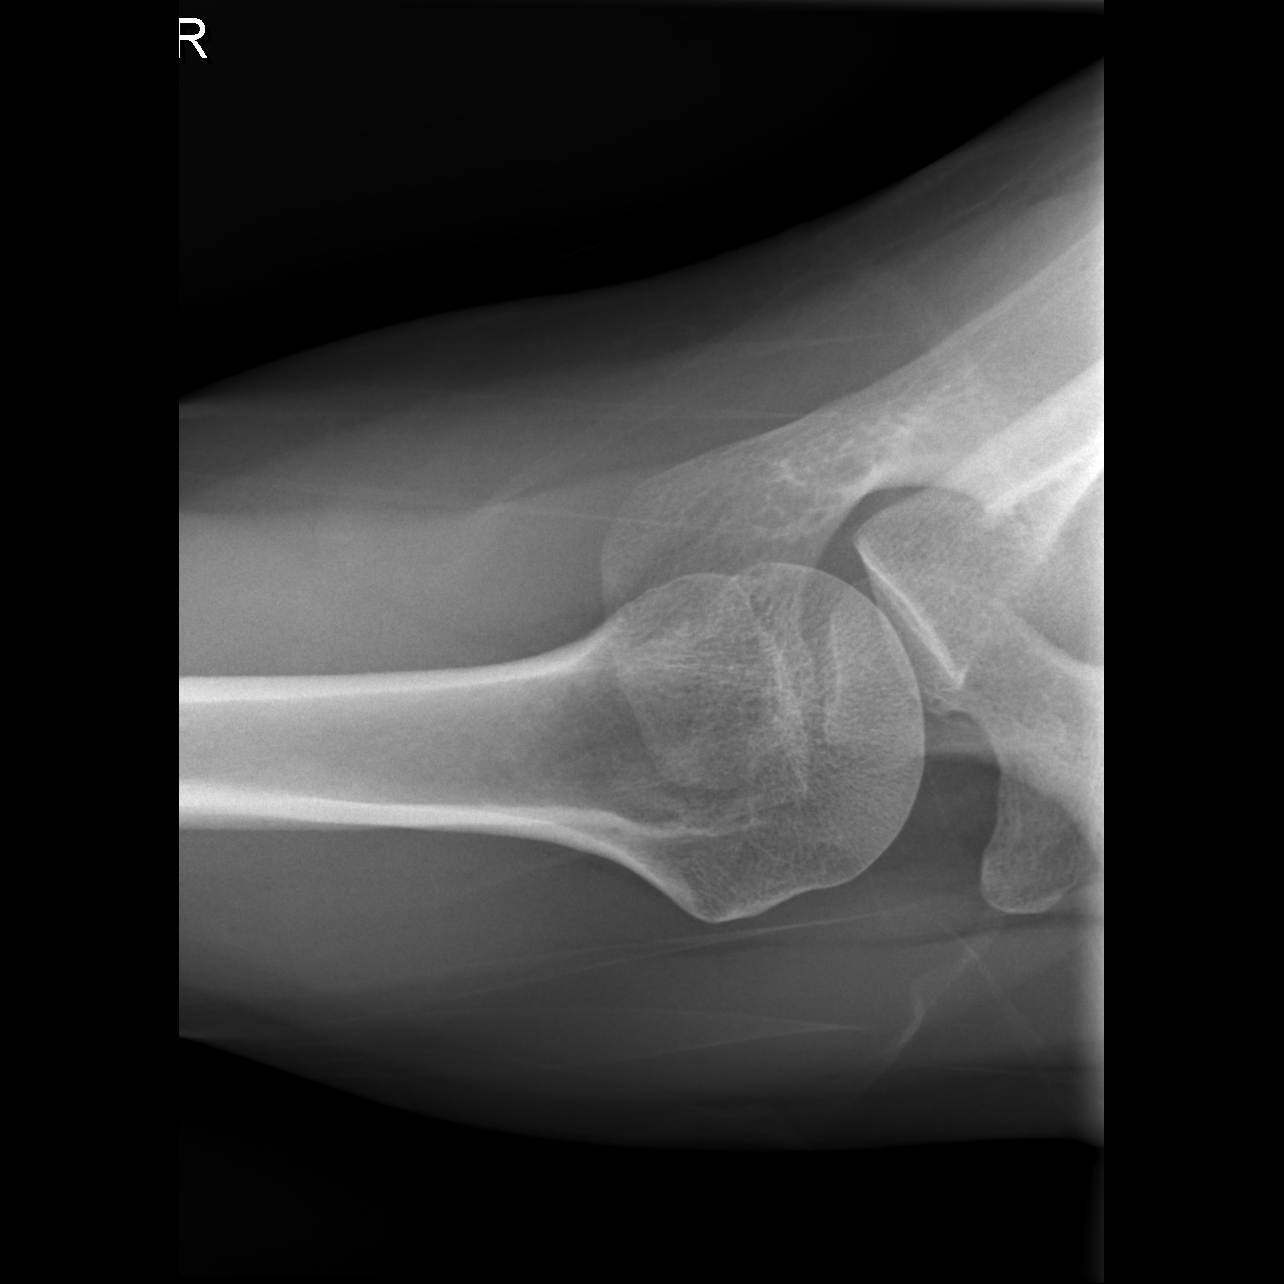

[3 of 3 positions shown; findings below may reference images not displayed]

FINDINGS: There is no evidence of fracture or dislocation. There is no
evidence of arthropathy or other focal bone abnormality. Soft
tissues are unremarkable.
IMPRESSION: Negative.
